# Patient Record
Sex: Male | Born: 1950 | Race: White | Hispanic: No | Marital: Single | Smoking: Never smoker
Health system: Southern US, Community
[De-identification: ages and names within clinical notes are randomized; demographics above are authoritative.]

## PROBLEM LIST (undated history)

## (undated) HISTORY — PX: COLONOSCOPY: SHX174

## (undated) HISTORY — PX: TONSILLECTOMY: SHX5217

## (undated) HISTORY — PX: WISDOM TOOTH EXTRACTION: SHX21

## (undated) HISTORY — PX: BRONCHOSCOPY: SUR163

---

## 1998-12-07 ENCOUNTER — Ambulatory Visit: Admission: RE | Admit: 1998-12-07 | Discharge: 1998-12-07 | Payer: Self-pay | Admitting: Internal Medicine

## 2020-03-14 DIAGNOSIS — Z862 Personal history of diseases of the blood and blood-forming organs and certain disorders involving the immune mechanism: Secondary | ICD-10-CM

## 2020-03-14 DIAGNOSIS — I340 Nonrheumatic mitral (valve) insufficiency: Secondary | ICD-10-CM

## 2020-03-14 DIAGNOSIS — I5021 Acute systolic (congestive) heart failure: Secondary | ICD-10-CM

## 2020-03-14 DIAGNOSIS — I361 Nonrheumatic tricuspid (valve) insufficiency: Secondary | ICD-10-CM

## 2020-03-14 DIAGNOSIS — I119 Hypertensive heart disease without heart failure: Secondary | ICD-10-CM

## 2020-03-14 DIAGNOSIS — E876 Hypokalemia: Secondary | ICD-10-CM

## 2020-03-15 DIAGNOSIS — I480 Paroxysmal atrial fibrillation: Secondary | ICD-10-CM

## 2020-04-13 ENCOUNTER — Other Ambulatory Visit: Payer: Self-pay

## 2020-04-13 DIAGNOSIS — R0602 Shortness of breath: Secondary | ICD-10-CM

## 2020-04-13 DIAGNOSIS — I5021 Acute systolic (congestive) heart failure: Secondary | ICD-10-CM | POA: Insufficient documentation

## 2020-04-13 DIAGNOSIS — Z91199 Patient's noncompliance with other medical treatment and regimen due to unspecified reason: Secondary | ICD-10-CM

## 2020-04-13 DIAGNOSIS — Z9119 Patient's noncompliance with other medical treatment and regimen: Secondary | ICD-10-CM

## 2020-04-13 DIAGNOSIS — F1010 Alcohol abuse, uncomplicated: Secondary | ICD-10-CM

## 2020-04-13 DIAGNOSIS — I480 Paroxysmal atrial fibrillation: Secondary | ICD-10-CM

## 2020-04-13 DIAGNOSIS — D869 Sarcoidosis, unspecified: Secondary | ICD-10-CM

## 2020-04-13 DIAGNOSIS — R079 Chest pain, unspecified: Secondary | ICD-10-CM

## 2020-04-13 DIAGNOSIS — I119 Hypertensive heart disease without heart failure: Secondary | ICD-10-CM

## 2020-04-13 DIAGNOSIS — J90 Pleural effusion, not elsewhere classified: Secondary | ICD-10-CM

## 2020-04-13 DIAGNOSIS — I509 Heart failure, unspecified: Secondary | ICD-10-CM | POA: Insufficient documentation

## 2020-04-13 DIAGNOSIS — I313 Pericardial effusion (noninflammatory): Secondary | ICD-10-CM

## 2020-04-13 DIAGNOSIS — E876 Hypokalemia: Secondary | ICD-10-CM

## 2020-04-13 DIAGNOSIS — Z862 Personal history of diseases of the blood and blood-forming organs and certain disorders involving the immune mechanism: Secondary | ICD-10-CM

## 2020-04-13 DIAGNOSIS — I3139 Other pericardial effusion (noninflammatory): Secondary | ICD-10-CM

## 2020-04-13 HISTORY — DX: Heart failure, unspecified: I50.9

## 2020-04-13 HISTORY — DX: Pleural effusion, not elsewhere classified: J90

## 2020-04-13 HISTORY — DX: Patient's noncompliance with other medical treatment and regimen: Z91.19

## 2020-04-13 HISTORY — DX: Personal history of diseases of the blood and blood-forming organs and certain disorders involving the immune mechanism: Z86.2

## 2020-04-13 HISTORY — DX: Hypokalemia: E87.6

## 2020-04-13 HISTORY — DX: Sarcoidosis, unspecified: D86.9

## 2020-04-13 HISTORY — DX: Alcohol abuse, uncomplicated: F10.10

## 2020-04-13 HISTORY — DX: Acute systolic (congestive) heart failure: I50.21

## 2020-04-13 HISTORY — DX: Chest pain, unspecified: R07.9

## 2020-04-13 HISTORY — DX: Shortness of breath: R06.02

## 2020-04-13 HISTORY — DX: Patient's noncompliance with other medical treatment and regimen due to unspecified reason: Z91.199

## 2020-04-13 HISTORY — DX: Paroxysmal atrial fibrillation: I48.0

## 2020-04-13 HISTORY — DX: Hypertensive heart disease without heart failure: I11.9

## 2020-04-13 HISTORY — DX: Other pericardial effusion (noninflammatory): I31.39

## 2020-04-13 HISTORY — DX: Pericardial effusion (noninflammatory): I31.3

## 2020-04-16 ENCOUNTER — Encounter: Payer: Self-pay | Admitting: *Deleted

## 2020-04-16 DIAGNOSIS — I160 Hypertensive urgency: Secondary | ICD-10-CM

## 2020-04-16 DIAGNOSIS — I10 Essential (primary) hypertension: Secondary | ICD-10-CM

## 2020-04-16 HISTORY — DX: Hypertensive urgency: I16.0

## 2020-04-16 HISTORY — DX: Essential (primary) hypertension: I10

## 2020-04-20 NOTE — Progress Notes (Signed)
Cardiology Office Note:    Date:  04/21/2020   ID:  John Ingram, DOB July 08, 1950, MRN [id number]  PCP:  Eloisa Northern, MD  Cardiologist:  Norman Herrlich, MD    Referring MD: No ref. provider found    ASSESSMENT:    1. Chronic systolic heart failure (HCC)   2. History of sarcoidosis   3. Paroxysmal atrial fibrillation (HCC)   4. Chronic anticoagulation    PLAN:    In order of problems listed above:  1. CHF- stable.  Will increase Entresto to 41/59 mg daily.  Would like to obtain Cardiac MRI to further evaluate for sarcoidosis as underlying etiology of CHF.  Check CMP, CBC, ProBnP today.  If documented signature of cardiac sarcoidosis on MR he would benefit from ICD therapy 2. He is maintaining sinus rhythm.  Reports occasional skipped beats.  Continue Amiodarone 200 mg daily.  Check TSH/T3/T4 today 3. Tolerating Eliquis.  No signs of bleeding.  Continue current anticoagulation therapy. 4. Medications refilled today.   Next appointment: 1 month   Medication Adjustments/Labs and Tests Ordered: Current medicines are reviewed at length with the patient today.  Concerns regarding medicines are outlined above.  Orders Placed This Encounter  Procedures  . MR CARDIAC MORPHOLOGY W WO CONTRAST  . CBC  . Comprehensive metabolic panel  . TSH+T4F+T3Free  . Pro b natriuretic peptide (BNP)  . EKG 12-Lead   Meds ordered this encounter  Medications  . sacubitril-valsartan (ENTRESTO) 49-51 MG    Sig: Take 1 tablet by mouth 2 (two) times daily.    Dispense:  60 tablet    Refill:  3  . amiodarone (PACERONE) 200 MG tablet    Sig: Take 1 tablet (200 mg total) by mouth daily.    Dispense:  90 tablet    Refill:  3  . potassium chloride SA (KLOR-CON) 20 MEQ tablet    Sig: Take 1 tablet (20 mEq total) by mouth daily.    Dispense:  90 tablet    Refill:  3  . metoprolol tartrate (LOPRESSOR) 25 MG tablet    Sig: Take 1 tablet (25 mg total) by mouth 2 (two) times daily.    Dispense:  180  tablet    Refill:  3  . furosemide (LASIX) 40 MG tablet    Sig: Take 1 tablet (40 mg total) by mouth 2 (two) times daily.    Dispense:  180 tablet    Refill:  3  . ELIQUIS 5 MG TABS tablet    Sig: Take 1 tablet (5 mg total) by mouth 2 (two) times daily.    Dispense:  180 tablet    Refill:  3    No chief complaint on file.   History of Present Illness:    John Ingram is a 70 y.o. male with a hx of Sarcoidosis, CHF last seen in [facility] for acute CHF exacerbation and new onset AFib. Compliance with diet, lifestyle and medications: yes  CTA hospital 03/14/2020 chest no findings of aortopathy or pulmonary embolism but he did have small pleural effusions and small pericardial effusion.  And calcified mediastinal lymph nodes compatible with sarcoidosis. 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  Right ventricular function was normal there is also  mild mitral regurgitation mild tricuspid regurgitation with pulmonary artery systolic pressure 65 mmHg and a trivial pericardial effusion EKG performed 03/14/2020 showed atrial fibrillation with a rapid ventricular response. EKG 03/15/2020 from the ED showed sinus tachycardia Labs performed at the same time showed a potassium of 3.6 sodium 136 creatinine 0.9 GFR greater than 60 cc troponin a zone 0.08 BMP pro was quite elevated 1130 Cholesterol 117 LDL 84 triglyceride 103 HDL 72 Hemoglobin 15.4 hematocrit 45.5 INR 1.0  He was seen by me during the hospitalization I personally reviewed his EKG which showed sinus rhythm and left atrial enlargement.  Prior to discharge she was maintaining sinus rhythm on oral amiodarone load 40 mg twice daily  for 2 weeks and then 400 mg daily.  His heart failure was improved he was euvolemic asymptomatic on oral diuretic beta-blocker Entresto and potassium supplementation.  When seen in consultation by me I felt that electively should undergo cardiac MRI to define if he has cardiac sarcoidosis.   Mr Andringa reports no acute concerns today.  He reports improvement of symptoms.  Denies any chest pain, shortness of breath or lower extremity edema.  His activity level has return to baseline prior to hospitalization.  He endorses occasional skipped heart beats but has had that for years.  He has recently run out of his Amiodarone and Potassium meds.  He continues to have no shortness of breath edema palpitation or chest pain. Past Medical History:  Diagnosis Date  . Acute systolic heart failure (HCC) 04/13/2020  . Alcohol abuse 04/13/2020  . Chest pain at rest 04/13/2020  . CHF exacerbation (HCC) 04/13/2020  . History of sarcoidosis 04/13/2020  . Hypertensive heart disease 04/13/2020  . Hypertensive urgency 04/16/2020  . Hypokalemia 04/13/2020  . Noncompliance 04/13/2020  . Paroxysmal A-fib (HCC) 04/13/2020  . Pericardial effusion 04/13/2020  . Pleural effusion 04/13/2020  . Sarcoidosis 04/13/2020  . Shortness of breath 04/13/2020    Past Surgical History:  Procedure Laterality Date  . BRONCHOSCOPY    . COLONOSCOPY     X 2  . TONSILLECTOMY    . WISDOM TOOTH EXTRACTION      Current Medications: Current Meds  Medication Sig  . sacubitril-valsartan (ENTRESTO) 49-51 MG Take 1 tablet by mouth 2 (two) times daily.  Marland Kitchen thiamine 100 MG tablet Take 100 mg by mouth daily.  . [DISCONTINUED] ELIQUIS 5 MG TABS tablet Take 5 mg by mouth 2 (two) times daily.  . [DISCONTINUED] ENTRESTO 24-26 MG Take 1 tablet by mouth 2 (two) times daily.  . [DISCONTINUED] furosemide (LASIX) 40 MG tablet Take 40 mg by mouth 2 (two) times daily.  . [DISCONTINUED] metoprolol tartrate (LOPRESSOR) 25 MG tablet Take 25 mg by mouth 2  (two) times daily.  . [DISCONTINUED] potassium chloride SA (KLOR-CON) 20 MEQ tablet Take 20 mEq by mouth daily.     Allergies:   Patient has no known allergies.   Social History   Socioeconomic History  . Marital status: Single    Spouse name: Not on file  . Number of children: Not on file  . Years of education: Not on file  . Highest education level: Not on file  Occupational History  . Not on file  Tobacco Use  . Smoking status: Never Smoker  . Smokeless tobacco: Never Used  Vaping Use  . Vaping Use: Never used  Substance and Sexual Activity  . Alcohol use: Not Currently  . Drug use: Never  . Sexual activity: Not on file  Other  Topics Concern  . Not on file  Social History Narrative  . Not on file   Social Determinants of Health   Financial Resource Strain: Not on file  Food Insecurity: Not on file  Transportation Needs: Not on file  Physical Activity: Not on file  Stress: Not on file  Social Connections: Not on file     Family History: The patient's family history includes Aneurysm in his mother; Hypertension in his mother; Pancreatic cancer in his father; Prostate cancer in his father; Skin cancer in his paternal grandfather. ROS:   Please see the history of present illness.    All other systems reviewed and are negative.  EKGs/Labs/Other Studies Reviewed:    The following studies were reviewed today:  EKG:  EKG ordered today and personally reviewed.  The ekg ordered today demonstrates NSR with LVH.   Recent Labs: No results found for requested labs within last 8760 hours.  Recent Lipid Panel No results found for: CHOL, TRIG, HDL, CHOLHDL, VLDL, LDLCALC, LDLDIRECT  Physical Exam:    VS:  BP (!) 156/100   Pulse 69   Ht 5\' 10"  (1.778 m)   Wt 211 lb 9.6 oz (96 kg)   SpO2 97%   BMI 30.36 kg/m     Wt Readings from Last 3 Encounters:  04/21/20 211 lb 9.6 oz (96 kg)     GEN:  Well nourished, well developed in no acute distress HEENT: Normal NECK:  No JVD; No carotid bruits LYMPHATICS: No lymphadenopathy CARDIAC: RRR, no murmurs, rubs, gallops RESPIRATORY:  Clear to auscultation without rales, wheezing or rhonchi  ABDOMEN: Soft, non-tender, non-distended MUSCULOSKELETAL:  No edema; No deformity  SKIN: Warm and dry NEUROLOGIC:  Alert and oriented x 3 PSYCHIATRIC:  Normal affect    Signed, 04/23/20, MD  04/21/2020 4:41 PM    [facility]

## 2020-04-21 ENCOUNTER — Ambulatory Visit (INDEPENDENT_AMBULATORY_CARE_PROVIDER_SITE_OTHER): Payer: Self-pay | Admitting: Cardiology

## 2020-04-21 ENCOUNTER — Other Ambulatory Visit: Payer: Self-pay

## 2020-04-21 ENCOUNTER — Encounter: Payer: Self-pay | Admitting: Cardiology

## 2020-04-21 VITALS — BP 156/100 | HR 69 | Ht 70.0 in | Wt 211.6 lb

## 2020-04-21 DIAGNOSIS — Z862 Personal history of diseases of the blood and blood-forming organs and certain disorders involving the immune mechanism: Secondary | ICD-10-CM

## 2020-04-21 DIAGNOSIS — Z7901 Long term (current) use of anticoagulants: Secondary | ICD-10-CM

## 2020-04-21 DIAGNOSIS — I480 Paroxysmal atrial fibrillation: Secondary | ICD-10-CM

## 2020-04-21 DIAGNOSIS — I5022 Chronic systolic (congestive) heart failure: Secondary | ICD-10-CM

## 2020-04-21 MED ORDER — ENTRESTO 49-51 MG PO TABS: 1.0000 | ORAL_TABLET | Freq: Two times a day (BID) | ORAL | 3 refills | Status: DC

## 2020-04-21 MED ORDER — METOPROLOL TARTRATE 25 MG PO TABS: 25.0000 mg | ORAL_TABLET | Freq: Two times a day (BID) | ORAL | 3 refills | Status: DC

## 2020-04-21 MED ORDER — AMIODARONE HCL 200 MG PO TABS: 200.0000 mg | ORAL_TABLET | Freq: Every day | ORAL | 3 refills | Status: DC

## 2020-04-21 MED ORDER — FUROSEMIDE 40 MG PO TABS: 40.0000 mg | ORAL_TABLET | Freq: Two times a day (BID) | ORAL | 3 refills | Status: DC

## 2020-04-21 MED ORDER — ELIQUIS 5 MG PO TABS: 5.0000 mg | ORAL_TABLET | Freq: Two times a day (BID) | ORAL | 3 refills | Status: DC

## 2020-04-21 MED ORDER — POTASSIUM CHLORIDE CRYS ER 20 MEQ PO TBCR: 20.0000 meq | EXTENDED_RELEASE_TABLET | Freq: Every day | ORAL | 3 refills | Status: DC

## 2020-04-21 NOTE — Patient Instructions (Signed)
Medication Instructions:  Your physician has recommended you make the following change in your medication:  INCREASE: Entresto 49/51 take one tablet by mouth twice daily.  *If you need a refill on your cardiac medications before your next appointment, please call your pharmacy*   Lab Work: Your physician recommends that you return for lab work in: TODAY CMP, ProBNP, CBC, TSH, T3, T4 If you have labs (blood work) drawn today and your tests are completely normal, you will receive your results only by: Marland Kitchen MyChart Message (if you have MyChart) OR . A paper copy in the mail If you have any lab test that is abnormal or we need to change your treatment, we will call you to review the results.   Testing/Procedures: Your physician has requested that you have a cardiac MRI. Cardiac MRI uses a computer to create images of your heart as its beating, producing both still and moving pictures of your heart and major blood vessels. For further information please visit [email or web address]. Please follow the instruction sheet given to you today for more information.  You are scheduled for Cardiac MRI on ______. Please arrive at the [facility] main entrance of [facility] at ______ (30-45 minutes prior to test start time). ?   [facility]  [address]  [place], [place] [address]  [phone]  Proceed to the [facility] Radiology Department (First Floor).  ?  Magnetic resonance imaging (MRI) is a painless test that produces images of the inside of the body without using X-rays. During an MRI, strong magnets and radio waves work together in a Data processing manager to form detailed images. MRI images may provide more details about a medical condition than X-rays, CT scans, and ultrasounds can provide.   You may be given earphones to listen for instructions.   You may eat a light breakfast and take medications as ordered with the exception of Furosemide (fluid pill, other).  If a contrast  material will be used, an IV will be inserted into one of your veins. Contrast material will be injected into your IV.   You will be asked to remove all metal, including: Watch, jewelry, and other metal objects including hearing aids, hair pieces and dentures. (Braces and fillings normally are not a problem.)   If contrast material was used:  It will leave your body through your urine within a day. You may be told to drink plenty of fluids to help flush the contrast material out of your system.  TEST WILL TAKE APPROXIMATELY 1 HOUR  PLEASE NOTIFY SCHEDULING AT LEAST 24 HOURS IN ADVANCE IF YOU ARE UNABLE TO KEEP YOUR APPOINTMENT.     Follow-Up: At [facility], you and your health needs are our priority.  As part of our continuing mission to provide you with exceptional heart care, we have created designated Provider Care Teams.  These Care Teams include your primary Cardiologist (physician) and Advanced Practice Providers (APPs -  Physician Assistants and Nurse Practitioners) who all work together to provide you with the care you need, when you need it.  We recommend signing up for the patient portal called "MyChart".  Sign up information is provided on this After Visit Summary.  MyChart is used to connect with patients for Virtual Visits (Telemedicine).  Patients are able to view lab/test results, encounter notes, upcoming appointments, etc.  Non-urgent messages can be sent to your provider as well.   To learn more about what you can do with MyChart, go to  [email or web address].    Your next appointment:   1 month(s)  The format for your next appointment:   In Person  Provider:   Norman Herrlich, MD   Other Instructions

## 2020-04-22 ENCOUNTER — Telehealth: Payer: Self-pay

## 2020-04-22 ENCOUNTER — Telehealth: Payer: Self-pay | Admitting: Cardiology

## 2020-04-22 LAB — CBC
Hematocrit: 51.8 % — ABNORMAL HIGH (ref 37.5–51.0)
Hemoglobin: 17.2 g/dL (ref 13.0–17.7)
MCH: 29.7 pg (ref 26.6–33.0)
MCHC: 33.2 g/dL (ref 31.5–35.7)
MCV: 89 fL (ref 79–97)
Platelets: 212 10*3/uL (ref 150–450)
RBC: 5.8 x10E6/uL (ref 4.14–5.80)
RDW: 11.9 % (ref 11.6–15.4)
WBC: 7.1 10*3/uL (ref 3.4–10.8)

## 2020-04-22 LAB — COMPREHENSIVE METABOLIC PANEL
ALT: 15 IU/L (ref 0–44)
AST: 21 IU/L (ref 0–40)
Albumin/Globulin Ratio: 2.2 (ref 1.2–2.2)
Albumin: 4.6 g/dL (ref 3.8–4.8)
Alkaline Phosphatase: 105 IU/L (ref 44–121)
BUN/Creatinine Ratio: 17 (ref 10–24)
BUN: 20 mg/dL (ref 8–27)
Bilirubin Total: 0.9 mg/dL (ref 0.0–1.2)
CO2: 26 mmol/L (ref 20–29)
Calcium: 9.4 mg/dL (ref 8.6–10.2)
Chloride: 98 mmol/L (ref 96–106)
Creatinine, Ser: 1.16 mg/dL (ref 0.76–1.27)
GFR calc Af Amer: 74 mL/min/{1.73_m2} (ref >59)
GFR calc non Af Amer: 64 mL/min/{1.73_m2} (ref >59)
Globulin, Total: 2.1 g/dL (ref 1.5–4.5)
Glucose: 104 mg/dL — ABNORMAL HIGH (ref 65–99)
Potassium: 4.6 mmol/L (ref 3.5–5.2)
Sodium: 142 mmol/L (ref 134–144)
Total Protein: 6.7 g/dL (ref 6.0–8.5)

## 2020-04-22 LAB — PRO B NATRIURETIC PEPTIDE: NT-Pro BNP: 832 pg/mL — ABNORMAL HIGH (ref 0–376)

## 2020-04-22 LAB — TSH+T4F+T3FREE
Free T4: 1.51 ng/dL (ref 0.82–1.77)
T3, Free: 3.1 pg/mL (ref 2.0–4.4)
TSH: 2.98 u[IU]/mL (ref 0.450–4.500)

## 2020-04-22 NOTE — Telephone Encounter (Signed)
Spoke with patient regarding results and recommendation.  Patient verbalizes understanding and is agreeable to plan of care. Advised patient to call back with any issues or concerns.  

## 2020-04-22 NOTE — Telephone Encounter (Signed)
Tried calling patient. No answer and no voicemail set up for me to leave a message. 

## 2020-04-22 NOTE — Telephone Encounter (Signed)
Spoke with patient regarding preferred weekdays and times for scheduling the Cardiac MRI ordered by Dr. Dulce Sellar.  Informed patient I will call with appointment information as soon as we hear regarding the prior authorization.

## 2020-04-22 NOTE — Telephone Encounter (Signed)
-----   Message from Brian J Munley, MD sent at 04/22/2020  7:55 AM EST ----- Good result no changes 

## 2020-04-22 NOTE — Telephone Encounter (Signed)
-----   Message from Baldo Daub, MD sent at 04/22/2020  7:55 AM EST ----- Good result no changes

## 2020-05-03 ENCOUNTER — Telehealth: Payer: Self-pay | Admitting: Cardiology

## 2020-05-03 ENCOUNTER — Encounter: Payer: Self-pay | Admitting: Cardiology

## 2020-05-03 NOTE — Telephone Encounter (Signed)
Spoke with patient regarding appointment on 06/01/20 at 12:00pm for the Cardiac MRI ordered by Dr. Alexander Mt time is 11:30 am --1st floor admissions office at [facility]---will mail information to patient

## 2020-05-17 ENCOUNTER — Telehealth: Payer: Self-pay | Admitting: Cardiology

## 2020-05-17 MED ORDER — METOPROLOL TARTRATE 25 MG PO TABS: 25.0000 mg | ORAL_TABLET | Freq: Two times a day (BID) | ORAL | 3 refills | Status: DC

## 2020-05-17 NOTE — Telephone Encounter (Signed)
Refill sent in per request.  

## 2020-05-17 NOTE — Telephone Encounter (Signed)
Patient came in office requesting refill on   metoprolol tartrate (LOPRESSOR) 25 MG tablet   Please advise.

## 2020-05-31 ENCOUNTER — Telehealth (HOSPITAL_COMMUNITY): Payer: Self-pay | Admitting: *Deleted

## 2020-05-31 NOTE — Telephone Encounter (Signed)
Reaching out to patient to offer assistance regarding upcoming cardiac imaging study; pt verbalizes understanding of appt date/time, parking situation and where to check in, and verified current allergies; name and call back number provided for further questions should they arise  Turrell Severt RN Navigator Cardiac Imaging [facility] [phone] office [phone] cell  

## 2020-06-01 ENCOUNTER — Other Ambulatory Visit: Payer: Self-pay

## 2020-06-01 ENCOUNTER — Ambulatory Visit (HOSPITAL_COMMUNITY)
Admission: RE | Admit: 2020-06-01 | Discharge: 2020-06-01 | Disposition: A | Discharge status: Home / Self Care | Payer: Self-pay | Source: Ambulatory Visit | Attending: Cardiology | Admitting: Cardiology

## 2020-06-01 DIAGNOSIS — Z862 Personal history of diseases of the blood and blood-forming organs and certain disorders involving the immune mechanism: Secondary | ICD-10-CM | POA: Insufficient documentation

## 2020-06-01 MED ORDER — GADOBUTROL 1 MMOL/ML IV SOLN
10.0000 mL | Freq: Once | INTRAVENOUS | Status: AC | PRN
  Administered 2020-06-01: 10 mL via INTRAVENOUS

## 2020-06-02 ENCOUNTER — Encounter: Payer: Self-pay | Admitting: Cardiology

## 2020-06-02 ENCOUNTER — Ambulatory Visit (INDEPENDENT_AMBULATORY_CARE_PROVIDER_SITE_OTHER): Payer: Self-pay | Admitting: Cardiology

## 2020-06-02 VITALS — BP 130/90 | HR 60 | Ht 70.0 in | Wt 210.0 lb

## 2020-06-02 DIAGNOSIS — I480 Paroxysmal atrial fibrillation: Secondary | ICD-10-CM

## 2020-06-02 DIAGNOSIS — I5022 Chronic systolic (congestive) heart failure: Secondary | ICD-10-CM

## 2020-06-02 DIAGNOSIS — Z7901 Long term (current) use of anticoagulants: Secondary | ICD-10-CM

## 2020-06-02 MED ORDER — SACUBITRIL-VALSARTAN 97-103 MG PO TABS: 1.0000 | ORAL_TABLET | Freq: Two times a day (BID) | ORAL | 3 refills | Status: DC

## 2020-06-02 NOTE — Progress Notes (Signed)
Cardiology Office Note:    Date:  06/02/2020   ID:  John Ingram, DOB Oct 19, 1950, MRN [id number]  PCP:  Eloisa Northern, MD  Cardiologist:  Norman Herrlich, MD    Referring MD: Eloisa Northern, MD    ASSESSMENT:    1. Chronic systolic heart failure (HCC)   2. Acute systolic heart failure (HCC)   3. Paroxysmal atrial fibrillation (HCC)   4. Chronic anticoagulation    PLAN:    In order of problems listed above:  1. He is improved tolerating guideline directed therapy with a significant improvement ejection fraction from severely reduced to mild to moderately ill continue therapy including amiodarone Eliquis maintaining sinus rhythm his loop diuretic beta-blocker Entresto and will plan to reassess ejection fraction 6 months. 2. Able maintaining sinus rhythm continue amiodarone Eliquis also recheck labs on amiodarone 3. Continue his  anticoagulant   Next appointment: 6 months   Medication Adjustments/Labs and Tests Ordered: Current medicines are reviewed at length with the patient today.  Concerns regarding medicines are outlined above.  Orders Placed This Encounter  Procedures  . EKG 12-Lead   No orders of the defined types were placed in this encounter.   Chief Complaint  Patient presents with  . Follow-up    History of Present Illness:    John Ingram is a 70 y.o. male with a hx of sarcoidosis and heart failure last seen 02/19/2021 Compliance with diet, lifestyle and medications: Yes  CTA hospital 03/14/2020 chest no findings of aortopathy or pulmonary embolism but he did have small pleural effusions and small pericardial effusion.  And calcified mediastinal lymph nodes compatible with sarcoidosis. 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  Right ventricular function was normal there is also mild mitral regurgitation mild tricuspid regurgitation with pulmonary artery systolic pressure 65 mmHg and a trivial pericardial effusion  With concerns of infiltrative cardiomyopathy you underwent cardiac MRI 06/01/2020 showing EF of 44% with basal anteroseptal and inferolateral hypokinesia normal right ventricular size and function EF 54% he did have some late gadolinium enhancement with the recommendation of evaluation for coronary artery disease.  Tolerates guideline directed therapy is not checking home blood pressures and has had no edema chest pain shortness of breath palpitation or syncope. He has had an objective increase in LV systolic function. I reviewed his report with him late gadolinium enhancement and he does not want to do a cardiac CTA at this time prefers to follow-up in the office and to reassess his ejection fraction next visit by echocardiogram. We will uptitrate his dose of Entresto next visit. Past Medical History:  Diagnosis Date  . Acute systolic heart failure (HCC) 04/13/2020  . Alcohol abuse 04/13/2020  . Chest pain at rest 04/13/2020  . CHF exacerbation (HCC) 04/13/2020  . History of sarcoidosis 04/13/2020  . Hypertensive heart disease 04/13/2020  . Hypertensive urgency 04/16/2020  . Hypokalemia 04/13/2020  . Noncompliance 04/13/2020  . Paroxysmal A-fib (HCC) 04/13/2020  . Pericardial effusion 04/13/2020  . Pleural effusion 04/13/2020  . Sarcoidosis 04/13/2020  . Shortness of breath 04/13/2020    Past Surgical History:  Procedure Laterality Date  . BRONCHOSCOPY    . COLONOSCOPY     X 2  . TONSILLECTOMY    .  WISDOM TOOTH EXTRACTION      Current Medications: Current Meds  Medication Sig  . amiodarone (PACERONE) 200 MG tablet Take 1 tablet (200 mg total) by mouth daily.  Marland Kitchen ELIQUIS 5 MG TABS tablet Take 1 tablet (5 mg total) by mouth 2 (two) times  daily.  . furosemide (LASIX) 40 MG tablet Take 1 tablet (40 mg total) by mouth 2 (two) times daily.  . metoprolol tartrate (LOPRESSOR) 25 MG tablet Take 1 tablet (25 mg total) by mouth 2 (two) times daily.  . potassium chloride SA (KLOR-CON) 20 MEQ tablet Take 1 tablet (20 mEq total) by mouth daily.  . sacubitril-valsartan (ENTRESTO) 49-51 MG Take 1 tablet by mouth 2 (two) times daily.  Marland Kitchen thiamine 100 MG tablet Take 100 mg by mouth daily.     Allergies:   Patient has no known allergies.   Social History   Socioeconomic History  . Marital status: Single    Spouse name: Not on file  . Number of children: Not on file  . Years of education: Not on file  . Highest education level: Not on file  Occupational History  . Not on file  Tobacco Use  . Smoking status: Never Smoker  . Smokeless tobacco: Never Used  Vaping Use  . Vaping Use: Never used  Substance and Sexual Activity  . Alcohol use: Not Currently  . Drug use: Never  . Sexual activity: Not on file  Other Topics Concern  . Not on file  Social History Narrative  . Not on file   Social Determinants of Health   Financial Resource Strain: Not on file  Food Insecurity: Not on file  Transportation Needs: Not on file  Physical Activity: Not on file  Stress: Not on file  Social Connections: Not on file     Family History: The patient's family history includes Aneurysm in his mother; Hypertension in his mother; Pancreatic cancer in his father; Prostate cancer in his father; Skin cancer in his paternal grandfather. ROS:   Please see the history of present illness.    All other systems reviewed and are negative.  EKGs/Labs/Other Studies Reviewed:    The following studies were reviewed today:  EKG:  EKG ordered today and personally reviewed.  The ekg ordered today demonstrates sinus rhythm and is normal he does not have conduction delay  Recent Labs: 04/21/2020: ALT 15; BUN 20; Creatinine, Ser 1.16; Hemoglobin 17.2; NT-Pro  BNP 832; Platelets 212; Potassium 4.6; Sodium 142; TSH 2.980  Recent Lipid Panel No results found for: CHOL, TRIG, HDL, CHOLHDL, VLDL, LDLCALC, LDLDIRECT  Physical Exam:    VS:  BP 130/90 (BP Location: Right Arm, Patient Position: Sitting, Cuff Size: Normal)   Pulse 60   Ht 5\' 10"  (1.778 m)   Wt 210 lb (95.3 kg)   SpO2 98%   BMI 30.13 kg/m     Wt Readings from Last 3 Encounters:  06/02/20 210 lb (95.3 kg)  04/21/20 211 lb 9.6 oz (96 kg)     GEN:  Well nourished, well developed in no acute distress HEENT: Normal NECK: No JVD; No carotid bruits LYMPHATICS: No lymphadenopathy CARDIAC: RRR, no murmurs, rubs, gallops RESPIRATORY:  Clear to auscultation without rales, wheezing or rhonchi  ABDOMEN: Soft, non-tender, non-distended MUSCULOSKELETAL:  No edema; No deformity  SKIN: Warm and dry NEUROLOGIC:  Alert and oriented x 3 PSYCHIATRIC:  Normal affect    Signed, 04/23/20, MD  06/02/2020 1:57 PM    [facility]

## 2020-06-02 NOTE — Patient Instructions (Signed)
Medication Instructions:  Your physician has recommended you make the following change in your medication:  INCREASE: Entresto 97/103 take one tablet by mouth twice daily.  *If you need a refill on your cardiac medications before your next appointment, please call your pharmacy*   Lab Work: Your physician recommends that you return for lab work in: 2 weeks CMP, TSH , T3, T4 If you have labs (blood work) drawn today and your tests are completely normal, you will receive your results only by: Marland Kitchen MyChart Message (if you have MyChart) OR . A paper copy in the mail If you have any lab test that is abnormal or we need to change your treatment, we will call you to review the results.   Testing/Procedures: None   Follow-Up: At [facility], you and your health needs are our priority.  As part of our continuing mission to provide you with exceptional heart care, we have created designated Provider Care Teams.  These Care Teams include your primary Cardiologist (physician) and Advanced Practice Providers (APPs -  Physician Assistants and Nurse Practitioners) who all work together to provide you with the care you need, when you need it.  We recommend signing up for the patient portal called "MyChart".  Sign up information is provided on this After Visit Summary.  MyChart is used to connect with patients for Virtual Visits (Telemedicine).  Patients are able to view lab/test results, encounter notes, upcoming appointments, etc.  Non-urgent messages can be sent to your provider as well.   To learn more about what you can do with MyChart, go to [email or web address].    Your next appointment:   6 month(s)  The format for your next appointment:   In Person  Provider:   Norman Herrlich, MD   Other Instructions

## 2020-12-07 ENCOUNTER — Other Ambulatory Visit: Payer: Self-pay

## 2020-12-07 ENCOUNTER — Encounter: Payer: Self-pay | Admitting: Cardiology

## 2020-12-07 ENCOUNTER — Ambulatory Visit (INDEPENDENT_AMBULATORY_CARE_PROVIDER_SITE_OTHER): Payer: Self-pay | Admitting: Cardiology

## 2020-12-07 VITALS — BP 144/88 | HR 73 | Ht 70.0 in | Wt 216.2 lb

## 2020-12-07 DIAGNOSIS — I5022 Chronic systolic (congestive) heart failure: Secondary | ICD-10-CM

## 2020-12-07 DIAGNOSIS — I480 Paroxysmal atrial fibrillation: Secondary | ICD-10-CM

## 2020-12-07 DIAGNOSIS — Z862 Personal history of diseases of the blood and blood-forming organs and certain disorders involving the immune mechanism: Secondary | ICD-10-CM

## 2020-12-07 DIAGNOSIS — Z79899 Other long term (current) drug therapy: Secondary | ICD-10-CM

## 2020-12-07 DIAGNOSIS — Z7901 Long term (current) use of anticoagulants: Secondary | ICD-10-CM

## 2020-12-07 NOTE — Patient Instructions (Signed)
Medication Instructions:  Your physician recommends that you continue on your current medications as directed. Please refer to the Current Medication list given to you today.  *If you need a refill on your cardiac medications before your next appointment, please call your pharmacy*   Lab Work: Your physician recommends that you return for lab work in: TODAY CMP, Lipids, TSH. ProBNP If you have labs (blood work) drawn today and your tests are completely normal, you will receive your results only by: MyChart Message (if you have MyChart) OR A paper copy in the mail If you have any lab test that is abnormal or we need to change your treatment, we will call you to review the results.   Testing/Procedures: None   Follow-Up: At [facility], you and your health needs are our priority.  As part of our continuing mission to provide you with exceptional heart care, we have created designated Provider Care Teams.  These Care Teams include your primary Cardiologist (physician) and Advanced Practice Providers (APPs -  Physician Assistants and Nurse Practitioners) who all work together to provide you with the care you need, when you need it.  We recommend signing up for the patient portal called "MyChart".  Sign up information is provided on this After Visit Summary.  MyChart is used to connect with patients for Virtual Visits (Telemedicine).  Patients are able to view lab/test results, encounter notes, upcoming appointments, etc.  Non-urgent messages can be sent to your provider as well.   To learn more about what you can do with MyChart, go to [email or web address].    Your next appointment:   6 month(s)  The format for your next appointment:   In Person  Provider:   Norman Herrlich, MD   Other Instructions

## 2020-12-07 NOTE — Progress Notes (Signed)
Cardiology Office Note:    Date:  12/07/2020   ID:  John Ingram, DOB 1950/11/26, MRN [id number]  PCP:  Eloisa Northern, MD  Cardiologist:  Norman Herrlich, MD    Referring MD: Eloisa Northern, MD    ASSESSMENT:    1. Paroxysmal atrial fibrillation (HCC)   2. Chronic anticoagulation   3. Chronic systolic heart failure (HCC)   4. History of sarcoidosis    PLAN:    In order of problems listed above:  Rey continues to do well maintaining sinus rhythm on low-dose amiodarone without toxicity and anticoagulant continue the same check liver function thyroid studies for toxicity Stable compensated he has no volume overload he will continue his current treatment including diuretic beta-blocker and high-dose Entresto.  He declines rechecking an echocardiogram at this point and declined SGLT2 inhibitor optimize heart failure treatment Stable asymptomatic   Next appointment: 6 months   Medication Adjustments/Labs and Tests Ordered: Current medicines are reviewed at length with the patient today.  Concerns regarding medicines are outlined above.  No orders of the defined types were placed in this encounter.  No orders of the defined types were placed in this encounter.   Chief complaint follow-up for atrial fibrillation and heart failure   History of Present Illness:    John Ingram is a 70 y.o. 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  With concerns of infiltrative cardiomyopathy you underwent cardiac MRI 06/01/2020 showing EF of 44% with basal anteroseptal and inferolateral hypokinesia normal right ventricular size and function EF 54% he did have some late gadolinium enhancement with the recommendation of evaluation for coronary artery disease.  When last seen 06/02/2020 he was improved on guideline directed therapy including amiodarone to maintain sinus rhythm with paroxysmal atrial fibrillation  anticoagulation with Eliquis and Entresto therapy.  He declined an ischemia evaluation with cardiac CTA at that time.  Compliance with diet, lifestyle and medications: Yes  Remains vigorous and active recently fatigues after heavy alcohol block but is not having shortness of breath edema orthopnea chest pain palpitation or syncope.  He is unaware of any recurrent atrial fibrillation and tolerates his anticoagulant without muscle pain or weakness. We discussed rechecking checking an echocardiogram he declines at this time We discussed adding an SGLT2 inhibitor he declines He has to take aspirin instead of Eliquis and I described that it does not prevent stroke in his situation he said he can continue his current anticoagulant. Past Medical History:  Diagnosis Date   Acute systolic heart failure (HCC) 04/13/2020   Alcohol abuse 04/13/2020   Chest pain at rest 04/13/2020   CHF exacerbation (HCC) 04/13/2020   History of sarcoidosis 04/13/2020   Hypertensive heart disease 04/13/2020   Hypertensive urgency 04/16/2020   Hypokalemia 04/13/2020   Noncompliance 04/13/2020   Paroxysmal A-fib (HCC) 04/13/2020   Pericardial effusion 04/13/2020   Pleural effusion 04/13/2020   Sarcoidosis 04/13/2020   Shortness of breath 04/13/2020    Past Surgical History:  Procedure Laterality Date   BRONCHOSCOPY     COLONOSCOPY     X 2   TONSILLECTOMY     WISDOM TOOTH EXTRACTION      Current Medications: Current Meds  Medication Sig   amiodarone (PACERONE) 200 MG tablet Take 1 tablet (200 mg total) by mouth daily.   ELIQUIS 5 MG TABS tablet Take 1 tablet (5 mg total) by mouth 2 (two) times daily.   furosemide (LASIX) 40  MG tablet Take 1 tablet (40 mg total) by mouth 2 (two) times daily.   metoprolol tartrate (LOPRESSOR) 25 MG tablet Take 1 tablet (25 mg total) by mouth 2 (two) times daily.   potassium chloride SA (KLOR-CON) 20 MEQ tablet Take 1 tablet (20 mEq total) by mouth daily.   sacubitril-valsartan (ENTRESTO)  97-103 MG Take 1 tablet by mouth 2 (two) times daily.   thiamine 100 MG tablet Take 100 mg by mouth daily.     Allergies:   Patient has no known allergies.   Social History   Socioeconomic History   Marital status: Single    Spouse name: Not on file   Number of children: Not on file   Years of education: Not on file   Highest education level: Not on file  Occupational History   Not on file  Tobacco Use   Smoking status: Never   Smokeless tobacco: Never  Vaping Use   Vaping Use: Never used  Substance and Sexual Activity   Alcohol use: Not Currently   Drug use: Never   Sexual activity: Not on file  Other Topics Concern   Not on file  Social History Narrative   Not on file   Social Determinants of Health   Financial Resource Strain: Not on file  Food Insecurity: Not on file  Transportation Needs: Not on file  Physical Activity: Not on file  Stress: Not on file  Social Connections: Not on file     Family History: The patient's family history includes Aneurysm in his mother; Hypertension in his mother; Pancreatic cancer in his father; Prostate cancer in his father; Skin cancer in his paternal grandfather. ROS:   Please see the history of present illness.    All other systems reviewed and are negative.  EKGs/Labs/Other Studies Reviewed:    The following studies were reviewed today:  EKG:  EKG ordered today and personally reviewed.  The ekg ordered today demonstrates sinus rhythm first-degree AV block  Recent Labs: 04/21/2020: ALT 15; BUN 20; Creatinine, Ser 1.16; Hemoglobin 17.2; NT-Pro BNP 832; Platelets 212; Potassium 4.6; Sodium 142; TSH 2.980  Recent Lipid Panel No results found for: CHOL, TRIG, HDL, CHOLHDL, VLDL, LDLCALC, LDLDIRECT  Physical Exam:    VS:  BP (!) 144/88 (BP Location: Right Arm, Patient Position: Sitting)   Pulse 73   Ht 5\' 10"  (1.778 m)   Wt 216 lb 3.2 oz (98.1 kg)   SpO2 99%   BMI 31.02 kg/m     Wt Readings from Last 3 Encounters:   12/07/20 216 lb 3.2 oz (98.1 kg)  06/02/20 210 lb (95.3 kg)  04/21/20 211 lb 9.6 oz (96 kg)     GEN: Does not look chronically ill no skin lesions of sarcoidosis well nourished, well developed in no acute distress HEENT: Normal NECK: No JVD; No carotid bruits LYMPHATICS: No lymphadenopathy CARDIAC: RRR, no murmurs, rubs, gallops RESPIRATORY:  Clear to auscultation without rales, wheezing or rhonchi  ABDOMEN: Soft, non-tender, non-distended MUSCULOSKELETAL:  No edema; No deformity  SKIN: Warm and dry NEUROLOGIC:  Alert and oriented x 3 PSYCHIATRIC:  Normal affect    Signed, 04/23/20, MD  12/07/2020 2:26 PM    [facility]

## 2020-12-08 ENCOUNTER — Telehealth: Payer: Self-pay

## 2020-12-08 LAB — COMPREHENSIVE METABOLIC PANEL
ALT: 15 IU/L (ref 0–44)
AST: 20 IU/L (ref 0–40)
Albumin/Globulin Ratio: 2.3 — ABNORMAL HIGH (ref 1.2–2.2)
Albumin: 4.5 g/dL (ref 3.8–4.8)
Alkaline Phosphatase: 97 IU/L (ref 44–121)
BUN/Creatinine Ratio: 18 (ref 10–24)
BUN: 18 mg/dL (ref 8–27)
Bilirubin Total: 0.6 mg/dL (ref 0.0–1.2)
CO2: 26 mmol/L (ref 20–29)
Calcium: 9.7 mg/dL (ref 8.6–10.2)
Chloride: 98 mmol/L (ref 96–106)
Creatinine, Ser: 1 mg/dL (ref 0.76–1.27)
Globulin, Total: 2 g/dL (ref 1.5–4.5)
Glucose: 91 mg/dL (ref 65–99)
Potassium: 4.2 mmol/L (ref 3.5–5.2)
Sodium: 140 mmol/L (ref 134–144)
Total Protein: 6.5 g/dL (ref 6.0–8.5)
eGFR: 81 mL/min/{1.73_m2} (ref >59)

## 2020-12-08 LAB — LIPID PANEL
Chol/HDL Ratio: 3.3 ratio (ref 0.0–5.0)
Cholesterol, Total: 261 mg/dL — ABNORMAL HIGH (ref 100–199)
HDL: 78 mg/dL (ref >39)
LDL Chol Calc (NIH): 139 mg/dL — ABNORMAL HIGH (ref 0–99)
Triglycerides: 248 mg/dL — ABNORMAL HIGH (ref 0–149)
VLDL Cholesterol Cal: 44 mg/dL — ABNORMAL HIGH (ref 5–40)

## 2020-12-08 LAB — TSH+T4F+T3FREE
Free T4: 1.5 ng/dL (ref 0.82–1.77)
T3, Free: 3 pg/mL (ref 2.0–4.4)
TSH: 3.03 u[IU]/mL (ref 0.450–4.500)

## 2020-12-08 LAB — PRO B NATRIURETIC PEPTIDE: NT-Pro BNP: 158 pg/mL (ref 0–376)

## 2020-12-08 NOTE — Telephone Encounter (Signed)
-----   Message from Brian J Munley, MD sent at 12/08/2020 11:33 AM EDT ----- CMP thyroid studies are normal  His lipids are elevated I think he should take his statin and I would like to try him on a low-dose of a low intensity pravastatin 20 mg daily with the evening meal. 

## 2020-12-08 NOTE — Telephone Encounter (Signed)
Tried calling patient. No answer and no voicemail set up for me to leave a message. 

## 2020-12-09 ENCOUNTER — Telehealth: Payer: Self-pay

## 2020-12-09 NOTE — Telephone Encounter (Signed)
Tried calling patient. No answer and no voicemail set up for me to leave a message. 

## 2020-12-09 NOTE — Telephone Encounter (Signed)
-----   Message from Brian J Munley, MD sent at 12/08/2020 11:33 AM EDT ----- CMP thyroid studies are normal  His lipids are elevated I think he should take his statin and I would like to try him on a low-dose of a low intensity pravastatin 20 mg daily with the evening meal. 

## 2020-12-10 ENCOUNTER — Telehealth: Payer: Self-pay

## 2020-12-10 MED ORDER — PRAVASTATIN SODIUM 20 MG PO TABS: 20.0000 mg | ORAL_TABLET | Freq: Every evening | ORAL | 3 refills | Status: DC

## 2020-12-10 NOTE — Telephone Encounter (Signed)
-----   Message from Baldo Daub, MD sent at 12/08/2020 11:33 AM EDT ----- CMP thyroid studies are normal  His lipids are elevated I think he should take his statin and I would like to try him on a low-dose of a low intensity pravastatin 20 mg daily with the evening meal.

## 2020-12-10 NOTE — Telephone Encounter (Signed)
Spoke with patient regarding results and recommendation.  Patient verbalizes understanding and is agreeable to plan of care. Advised patient to call back with any issues or concerns.  

## 2021-01-24 ENCOUNTER — Other Ambulatory Visit: Payer: Self-pay | Admitting: Cardiology

## 2021-02-22 ENCOUNTER — Other Ambulatory Visit: Payer: Self-pay | Admitting: Cardiology

## 2021-04-21 ENCOUNTER — Other Ambulatory Visit: Payer: Self-pay | Admitting: Cardiology

## 2021-05-20 ENCOUNTER — Other Ambulatory Visit: Payer: Self-pay | Admitting: Cardiology

## 2021-05-20 NOTE — Telephone Encounter (Signed)
Prescription refill request for Eliquis received. Indication:Afib Last office visit:9/22 Scr:1.0 Age: 71 Weight:98.1 kg  Prescription refilled

## 2021-06-14 ENCOUNTER — Other Ambulatory Visit: Payer: Self-pay

## 2021-06-14 ENCOUNTER — Ambulatory Visit (INDEPENDENT_AMBULATORY_CARE_PROVIDER_SITE_OTHER): Payer: Self-pay | Admitting: Cardiology

## 2021-06-14 ENCOUNTER — Encounter: Payer: Self-pay | Admitting: Cardiology

## 2021-06-14 VITALS — BP 138/88 | HR 70 | Ht 70.0 in | Wt 223.0 lb

## 2021-06-14 DIAGNOSIS — Z79899 Other long term (current) drug therapy: Secondary | ICD-10-CM

## 2021-06-14 DIAGNOSIS — I480 Paroxysmal atrial fibrillation: Secondary | ICD-10-CM

## 2021-06-14 DIAGNOSIS — I5022 Chronic systolic (congestive) heart failure: Secondary | ICD-10-CM

## 2021-06-14 DIAGNOSIS — Z7901 Long term (current) use of anticoagulants: Secondary | ICD-10-CM

## 2021-06-14 MED ORDER — PRAVASTATIN SODIUM 20 MG PO TABS: 20.0000 mg | ORAL_TABLET | Freq: Every evening | ORAL | 1 refills | Status: DC

## 2021-06-14 MED ORDER — POTASSIUM CHLORIDE CRYS ER 20 MEQ PO TBCR: 20.0000 meq | EXTENDED_RELEASE_TABLET | Freq: Every day | ORAL | 1 refills | Status: DC

## 2021-06-14 MED ORDER — SACUBITRIL-VALSARTAN 97-103 MG PO TABS: 1.0000 | ORAL_TABLET | Freq: Two times a day (BID) | ORAL | 1 refills | Status: DC

## 2021-06-14 MED ORDER — AMIODARONE HCL 200 MG PO TABS: 200.0000 mg | ORAL_TABLET | Freq: Every day | ORAL | 1 refills | Status: DC

## 2021-06-14 MED ORDER — FUROSEMIDE 40 MG PO TABS
40.0000 mg | ORAL_TABLET | Freq: Two times a day (BID) | ORAL | 1 refills | Status: DC
Start: 2021-06-14 — End: 2022-03-09

## 2021-06-14 MED ORDER — ELIQUIS 5 MG PO TABS: 5.0000 mg | ORAL_TABLET | Freq: Two times a day (BID) | ORAL | 1 refills | Status: DC

## 2021-06-14 MED ORDER — METOPROLOL TARTRATE 25 MG PO TABS: 25.0000 mg | ORAL_TABLET | Freq: Two times a day (BID) | ORAL | 1 refills | Status: DC

## 2021-06-14 NOTE — Progress Notes (Signed)
?Cardiology Office Note:   ? ?Date:  06/14/2021  ? ?ID:  John Ingram, DOB 06-10-1950, MRN [id number] ? ?PCP:  Eloisa Northern, MD  ?Cardiologist:  Norman Herrlich, MD   ? ?Referring MD: Eloisa Northern, MD  ? ? ?ASSESSMENT:   ? ?1. Paroxysmal atrial fibrillation (HCC)   ?2. On amiodarone therapy   ?3. Chronic anticoagulation   ?4. Chronic systolic heart failure (HCC)   ? ?PLAN:   ? ?In order of problems listed above: ? ?John Ingram continues to do well with his atrial fibrillation maintaining sinus rhythm on low-dose amiodarone.  Because of his underlying sarcoidosis I will check a yearly chest x-ray to screen but no indication of lung toxicity.  Also check for lung liver and thyroid toxicity ?Continue amiodarone anticoagulant ?Compensated no fluid overload continue current treatment including diuretic beta-blocker high-dose Entresto check proBNP ?I asked him to consider EP consultation he declines ? ? ?Next appointment: 6 months ? ? ?Medication Adjustments/Labs and Tests Ordered: ?Current medicines are reviewed at length with the patient today.  Concerns regarding medicines are outlined above.  ?No orders of the defined types were placed in this encounter. ? ?No orders of the defined types were placed in this encounter. ? ? ?Chief Complaint  ?Patient presents with  ? Follow-up  ? Cardiomyopathy  ?  With sarcoidosis  ? Congestive Heart Failure  ? Atrial Fibrillation  ? ? ?History of Present Illness:   ? ?John Ingram is a 71 y.o. 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  With concerns of infiltrative cardiomyopathy he underwent cardiac MRI 06/01/2020 showing EF of 44% with basal anteroseptal and inferolateral hypokinesia normal right ventricular size and function EF 54% he did have some late gadolinium enhancement with the recommendation of evaluation for coronary artery disease.  When seen 06/02/2020 he was improved on guideline  directed therapy including amiodarone to maintain sinus rhythm with paroxysmal atrial fibrillation anticoagulation with Eliquis and Entresto therapy.  He declined an ischemia evaluation with cardiac CTA at that time.  ? ?Compliance with diet, lifestyle and medications: Yes ? ?He has done well with guideline directed medical therapy he is not having shortness of breath edema chest pain palpitation or syncope. ?No indication is having atrial fibrillation I asked him to purchase a Fitbit smart watch to screen himself for bradycardia and atrial fibrillation recurrence ?He tolerates his anticoagulant without bleeding ?No muscle pain or weakness from his ?Past Medical History:  ?Diagnosis Date  ? Acute systolic heart failure (HCC) 04/13/2020  ? Alcohol abuse 04/13/2020  ? Chest pain at rest 04/13/2020  ? CHF exacerbation (HCC) 04/13/2020  ? History of sarcoidosis 04/13/2020  ? Hypertensive heart disease 04/13/2020  ? Hypertensive urgency 04/16/2020  ? Hypokalemia 04/13/2020  ? Noncompliance 04/13/2020  ? Paroxysmal A-fib (HCC) 04/13/2020  ? Pericardial effusion 04/13/2020  ? Pleural effusion 04/13/2020  ? Sarcoidosis 04/13/2020  ? Shortness of breath 04/13/2020  ? ? ?Past Surgical History:  ?Procedure Laterality Date  ? BRONCHOSCOPY    ? COLONOSCOPY    ? X 2  ? TONSILLECTOMY    ? WISDOM TOOTH EXTRACTION    ? ? ?Current Medications: ?Current Meds  ?Medication Sig  ? amiodarone (PACERONE) 200 MG tablet Take 1 tablet by mouth once daily  ? ELIQUIS 5 MG TABS tablet Take 1 tablet by mouth twice daily  ? furosemide (LASIX) 40 MG tablet Take 1 tablet by mouth twice daily  ?  metoprolol tartrate (LOPRESSOR) 25 MG tablet Take 1 tablet by mouth twice daily  ? potassium chloride SA (KLOR-CON M) 20 MEQ tablet Take 1 tablet by mouth once daily  ? pravastatin (PRAVACHOL) 20 MG tablet Take 1 tablet (20 mg total) by mouth every evening.  ? sacubitril-valsartan (ENTRESTO) 97-103 MG Take 1 tablet by mouth 2 (two) times daily.  ? thiamine 100 MG tablet  Take 100 mg by mouth daily.  ?  ? ?Allergies:   Patient has no known allergies.  ? ?Social History  ? ?Socioeconomic History  ? Marital status: Single  ?  Spouse name: Not on file  ? Number of children: Not on file  ? Years of education: Not on file  ? Highest education level: Not on file  ?Occupational History  ? Not on file  ?Tobacco Use  ? Smoking status: Never  ?  Passive exposure: Never  ? Smokeless tobacco: Never  ?Vaping Use  ? Vaping Use: Never used  ?Substance and Sexual Activity  ? Alcohol use: Not Currently  ? Drug use: Never  ? Sexual activity: Not on file  ?Other Topics Concern  ? Not on file  ?Social History Narrative  ? Not on file  ? ?Social Determinants of Health  ? ?Financial Resource Strain: Not on file  ?Food Insecurity: Not on file  ?Transportation Needs: Not on file  ?Physical Activity: Not on file  ?Stress: Not on file  ?Social Connections: Not on file  ?  ? ?Family History: ?The patient's family history includes Aneurysm in his mother; Hypertension in his mother; Pancreatic cancer in his father; Prostate cancer in his father; Skin cancer in his paternal grandfather. ?ROS:   ?Please see the history of present illness.    ?All other systems reviewed and are negative. ? ?EKGs/Labs/Other Studies Reviewed:   ? ?The following studies were reviewed today: ? ?EKG:  EKG ordered today and personally reviewed.  The ekg ordered today demonstrates sinus rhythm first-degree AV block otherwise normal EKG ? ?Recent Labs: ?12/07/2020: ALT 15; BUN 18; Creatinine, Ser 1.00; NT-Pro BNP 158; Potassium 4.2; Sodium 140; TSH 3.030  ?Recent Lipid Panel ?   ?Component Value Date/Time  ? CHOL 261 (H) 12/07/2020 1442  ? TRIG 248 (H) 12/07/2020 1442  ? HDL 78 12/07/2020 1442  ? CHOLHDL 3.3 12/07/2020 1442  ? LDLCALC 139 (H) 12/07/2020 1442  ? ? ?Physical Exam:   ? ?VS:  BP 138/88 (BP Location: Right Arm)   Pulse 70   Ht 5\' 10"  (1.778 m)   Wt 223 lb (101.2 kg)   SpO2 98%   BMI 32.00 kg/m?    ? ?Wt Readings from Last 3  Encounters:  ?06/14/21 223 lb (101.2 kg)  ?12/07/20 216 lb 3.2 oz (98.1 kg)  ?06/02/20 210 lb (95.3 kg)  ?  ? ?GEN:  Well nourished, well developed in no acute distress he does not look chronically ill or debilitated ?HEENT: Normal ?NECK: No JVD; No carotid bruits ?LYMPHATICS: No lymphadenopathy ?CARDIAC: RRR, no murmurs, rubs, gallops ?RESPIRATORY:  Clear to auscultation without rales, wheezing or rhonchi  ?ABDOMEN: Soft, non-tender, non-distended ?MUSCULOSKELETAL:  No edema; No deformity  ?SKIN: Warm and dry ?NEUROLOGIC:  Alert and oriented x 3 ?PSYCHIATRIC:  Normal affect  ? ? ?Signed, ?08/02/20, MD  ?06/14/2021 2:29 PM    ?[facility]  ?

## 2021-06-14 NOTE — Patient Instructions (Signed)
Medication Instructions:  ?Your physician recommends that you continue on your current medications as directed. Please refer to the Current Medication list given to you today. ? ?*If you need a refill on your cardiac medications before your next appointment, please call your pharmacy* ? ? ?Lab Work: ?Your physician recommends that you return for lab work in: Today for CMP, TSH, T3, T4 free, ProBnp ? ?If you have labs (blood work) drawn today and your tests are completely normal, you will receive your results only by: ?MyChart Message (if you have MyChart) OR ?A paper copy in the mail ?If you have any lab test that is abnormal or we need to change your treatment, we will call you to review the results. ? ? ?Testing/Procedures: ?CXR at [facility] ? ? ?Follow-Up: ?At [facility], you and your health needs are our priority.  As part of our continuing mission to provide you with exceptional heart care, we have created designated Provider Care Teams.  These Care Teams include your primary Cardiologist (physician) and Advanced Practice Providers (APPs -  Physician Assistants and Nurse Practitioners) who all work together to provide you with the care you need, when you need it. ? ?We recommend signing up for the patient portal called "MyChart".  Sign up information is provided on this After Visit Summary.  MyChart is used to connect with patients for Virtual Visits (Telemedicine).  Patients are able to view lab/test results, encounter notes, upcoming appointments, etc.  Non-urgent messages can be sent to your provider as well.   ?To learn more about what you can do with MyChart, go to [email or web address].   ? ?Your next appointment:   ?6 month(s) ? ?The format for your next appointment:   ?In Person ? ?Provider:   ?Norman Herrlich, MD  ? ? ?Other Instructions ?Recommend FitBit to monitor Atrial Fibrillation  ?

## 2021-06-14 NOTE — Addendum Note (Signed)
Addended by: Jerl Santos R on: 06/14/2021 02:43 PM ? ? Modules accepted: Orders ? ?

## 2021-06-14 NOTE — Addendum Note (Signed)
Addended by: Roosvelt Harps R on: 06/14/2021 02:53 PM ? ? Modules accepted: Orders ? ?

## 2021-06-15 ENCOUNTER — Telehealth: Payer: Self-pay

## 2021-06-15 LAB — COMPREHENSIVE METABOLIC PANEL
ALT: 20 IU/L (ref 0–44)
AST: 26 IU/L (ref 0–40)
Albumin/Globulin Ratio: 2 (ref 1.2–2.2)
Albumin: 4.3 g/dL (ref 3.8–4.8)
Alkaline Phosphatase: 95 IU/L (ref 44–121)
BUN/Creatinine Ratio: 15 (ref 10–24)
BUN: 18 mg/dL (ref 8–27)
Bilirubin Total: 0.5 mg/dL (ref 0.0–1.2)
CO2: 27 mmol/L (ref 20–29)
Calcium: 9.3 mg/dL (ref 8.6–10.2)
Chloride: 100 mmol/L (ref 96–106)
Creatinine, Ser: 1.17 mg/dL (ref 0.76–1.27)
Globulin, Total: 2.1 g/dL (ref 1.5–4.5)
Glucose: 95 mg/dL (ref 70–99)
Potassium: 4.7 mmol/L (ref 3.5–5.2)
Sodium: 141 mmol/L (ref 134–144)
Total Protein: 6.4 g/dL (ref 6.0–8.5)
eGFR: 67 mL/min/{1.73_m2} (ref >59)

## 2021-06-15 LAB — T4, FREE: Free T4: 1.71 ng/dL (ref 0.82–1.77)

## 2021-06-15 LAB — TSH: TSH: 3.95 u[IU]/mL (ref 0.450–4.500)

## 2021-06-15 LAB — PRO B NATRIURETIC PEPTIDE: NT-Pro BNP: 184 pg/mL (ref 0–376)

## 2021-06-15 LAB — T3, FREE: T3, Free: 2.8 pg/mL (ref 2.0–4.4)

## 2021-06-15 NOTE — Telephone Encounter (Signed)
-----   Message from Baldo Daub, MD sent at 06/15/2021  7:52 AM EDT ----- ?All results are good including his BNP level for his congestive heart failure. ?

## 2021-06-15 NOTE — Telephone Encounter (Signed)
Patient notified of results.

## 2021-06-30 ENCOUNTER — Telehealth: Payer: Self-pay

## 2021-06-30 NOTE — Telephone Encounter (Signed)
No VM setup. ?CXR is good per B[facility]. ?

## 2021-07-01 NOTE — Telephone Encounter (Signed)
Patient returning call.

## 2021-07-01 NOTE — Telephone Encounter (Signed)
No vm set up. 

## 2021-07-01 NOTE — Telephone Encounter (Signed)
Results reviewed with pt as per Dr. Munley's note.  Pt verbalized understanding and had no additional questions. Routed to PCP  

## 2021-12-22 ENCOUNTER — Ambulatory Visit: Payer: Self-pay | Attending: Cardiology | Admitting: Cardiology

## 2021-12-22 ENCOUNTER — Encounter: Payer: Self-pay | Admitting: Cardiology

## 2021-12-22 VITALS — BP 138/80 | HR 72 | Ht 70.0 in | Wt 225.0 lb

## 2021-12-22 DIAGNOSIS — Z862 Personal history of diseases of the blood and blood-forming organs and certain disorders involving the immune mechanism: Secondary | ICD-10-CM

## 2021-12-22 DIAGNOSIS — Z79899 Other long term (current) drug therapy: Secondary | ICD-10-CM

## 2021-12-22 DIAGNOSIS — I480 Paroxysmal atrial fibrillation: Secondary | ICD-10-CM

## 2021-12-22 DIAGNOSIS — Z7901 Long term (current) use of anticoagulants: Secondary | ICD-10-CM

## 2021-12-22 MED ORDER — AMIODARONE HCL 200 MG PO TABS: 200.0000 mg | ORAL_TABLET | ORAL | 3 refills | Status: DC

## 2021-12-22 NOTE — Patient Instructions (Addendum)
Medication Instructions:  Your physician has recommended you make the following change in your medication:   START: Amiodarone 200 mg every other day  *If you need a refill on your cardiac medications before your next appointment, please call your pharmacy*   Lab Work: Your physician recommends that you return for lab work in:   Labs today: CMP,TSH T3 T4  If you have labs (blood work) drawn today and your tests are completely normal, you will receive your results only by: [place] (if you have [place]) OR A paper copy in the mail If you have any lab test that is abnormal or we need to change your treatment, we will call you to review the results.   Testing/Procedures: None   Follow-Up: At [facility], you and your health needs are our priority.  As part of our continuing mission to provide you with exceptional heart care, we have created designated Provider Care Teams.  These Care Teams include your primary Cardiologist (physician) and Advanced Practice Providers (APPs -  Physician Assistants and Nurse Practitioners) who all work together to provide you with the care you need, when you need it.  We recommend signing up for the patient portal called "MyChart".  Sign up information is provided on this After Visit Summary.  MyChart is used to connect with patients for Virtual Visits (Telemedicine).  Patients are able to view lab/test results, encounter notes, upcoming appointments, etc.  Non-urgent messages can be sent to your provider as well.   To learn more about what you can do with MyChart, go to [email or web address].    Your next appointment:   6 month(s)  The format for your next appointment:   In Person  Provider:   Shirlee More, MD    Other Instructions Buy an [employer] blood pressure device and record your blood pressures at home  Consider a Samsung or FitBit watch  Important Information About Sugar

## 2021-12-22 NOTE — Progress Notes (Signed)
Cardiology Office Note:    Date:  12/22/2021   ID:  John Ingram, DOB 04-22-1950, MRN [id number]  PCP:  Garwin Brothers, MD  Cardiologist:  Shirlee More, MD    Referring MD: Garwin Brothers, MD    ASSESSMENT:    1. Paroxysmal atrial fibrillation (HCC)   2. On amiodarone therapy   3. Chronic anticoagulation   4. History of sarcoidosis    PLAN:    In order of problems listed above:  Continues to do well maintaining sinus rhythm low-dose amiodarone in view of his lung disease reduced to the minimum effective dose 100 mg daily he will continue his anticoagulant I encouraged him to purchase a smart watch to monitor his heart rhythm Recheck CMP and thyroid studies no apparent toxicity   Next appointment: 6 months consider repeat echocardiogram around that time   Medication Adjustments/Labs and Tests Ordered: Current medicines are reviewed at length with the patient today.  Concerns regarding medicines are outlined above.  Orders Placed This Encounter  Procedures   Comp Met (CMET)   TSH+T4F+T3Free   EKG 12-Lead   Meds ordered this encounter  Medications   amiodarone (PACERONE) 200 MG tablet    Sig: Take 1 tablet (200 mg total) by mouth every other day.    Dispense:  45 tablet    Refill:  3    Chief Complaint  Patient presents with   Follow-up   Atrial Fibrillation    History of Present Illness:    John Ingram is a 71 y.o. male with a hx of pulmonary sarcoidosis heart failure initially ejection fraction 30% most recently 44% on cardiac MRI pulmonary artery hypertension and chronic anticoagulation last seen 06/24/2021.  Compliance with diet, lifestyle and medications: Yes  He has had nothing to suggest recurrent atrial fibrillation I encouraged him to purchase a smart watch to self monitor at home And tolerates amiodarone without side effect and his anticoagulant without bleeding He feels better and is not having edema shortness of breath chest pain palpitation or  syncope Past Medical History:  Diagnosis Date   Acute systolic heart failure ([place]) 04/13/2020   Alcohol abuse 04/13/2020   Chest pain at rest 04/13/2020   CHF exacerbation ([place]) 04/13/2020   History of sarcoidosis 04/13/2020   Hypertensive heart disease 04/13/2020   Hypertensive urgency 04/16/2020   Hypokalemia 04/13/2020   Noncompliance 04/13/2020   Paroxysmal A-fib ([place]) 04/13/2020   Pericardial effusion 04/13/2020   Pleural effusion 04/13/2020   Sarcoidosis 04/13/2020   Shortness of breath 04/13/2020    Past Surgical History:  Procedure Laterality Date   BRONCHOSCOPY     COLONOSCOPY     X 2   TONSILLECTOMY     WISDOM TOOTH EXTRACTION      Current Medications: Current Meds  Medication Sig   amiodarone (PACERONE) 200 MG tablet Take 1 tablet (200 mg total) by mouth every other day.   ELIQUIS 5 MG TABS tablet Take 1 tablet (5 mg total) by mouth 2 (two) times daily.   furosemide (LASIX) 40 MG tablet Take 1 tablet (40 mg total) by mouth 2 (two) times daily.   metoprolol tartrate (LOPRESSOR) 25 MG tablet Take 1 tablet (25 mg total) by mouth 2 (two) times daily.   potassium chloride SA (KLOR-CON M) 20 MEQ tablet Take 1 tablet (20 mEq total) by mouth daily.   pravastatin (PRAVACHOL) 20 MG tablet Take 1 tablet (20 mg total) by mouth every evening.   sacubitril-valsartan (ENTRESTO) 97-103 MG Take 1  tablet by mouth 2 (two) times daily.   thiamine 100 MG tablet Take 100 mg by mouth daily.   [DISCONTINUED] amiodarone (PACERONE) 200 MG tablet Take 1 tablet (200 mg total) by mouth daily.     Allergies:   Patient has no known allergies.   Social History   Socioeconomic History   Marital status: Single    Spouse name: Not on file   Number of children: Not on file   Years of education: Not on file   Highest education level: Not on file  Occupational History   Not on file  Tobacco Use   Smoking status: Never    Passive exposure: Never   Smokeless tobacco: Never  Vaping Use   Vaping Use:  Never used  Substance and Sexual Activity   Alcohol use: Not Currently   Drug use: Never   Sexual activity: Not on file  Other Topics Concern   Not on file  Social History Narrative   Not on file   Social Determinants of Health   Financial Resource Strain: Not on file  Food Insecurity: Not on file  Transportation Needs: Not on file  Physical Activity: Not on file  Stress: Not on file  Social Connections: Not on file     Family History: The patient's family history includes Aneurysm in his mother; Hypertension in his mother; Pancreatic cancer in his father; Prostate cancer in his father; Skin cancer in his paternal grandfather. ROS:   Please see the history of present illness.    All other systems reviewed and are negative.  EKGs/Labs/Other Studies Reviewed:    The following studies were reviewed today:  EKG:  EKG ordered today and personally reviewed.  The ekg ordered today demonstrates sinus rhythm 1 APC first-degree AV block  Recent Labs: 06/14/2021: ALT 20; BUN 18; Creatinine, Ser 1.17; NT-Pro BNP 184; Potassium 4.7; Sodium 141; TSH 3.950  Recent Lipid Panel    Component Value Date/Time   CHOL 261 (H) 12/07/2020 1442   TRIG 248 (H) 12/07/2020 1442   HDL 78 12/07/2020 1442   CHOLHDL 3.3 12/07/2020 1442   LDLCALC 139 (H) 12/07/2020 1442    Physical Exam:    VS:  BP 138/80   Pulse 72   Ht 5' 10"  (1.778 m)   Wt 225 lb (102.1 kg)   SpO2 99%   BMI 32.28 kg/m     Wt Readings from Last 3 Encounters:  12/22/21 225 lb (102.1 kg)  06/14/21 223 lb (101.2 kg)  12/07/20 216 lb 3.2 oz (98.1 kg)     GEN:  Well nourished, well developed in no acute distress HEENT: Normal NECK: No JVD; No carotid bruits LYMPHATICS: No lymphadenopathy CARDIAC: RRR, no murmurs, rubs, gallops RESPIRATORY:  Clear to auscultation without rales, wheezing or rhonchi  ABDOMEN: Soft, non-tender, non-distended MUSCULOSKELETAL:  No edema; No deformity  SKIN: Warm and dry NEUROLOGIC:  Alert  and oriented x 3 PSYCHIATRIC:  Normal affect    Signed, Shirlee More, MD  12/22/2021 3:05 PM    [facility]

## 2021-12-23 LAB — COMPREHENSIVE METABOLIC PANEL
ALT: 33 IU/L (ref 0–44)
AST: 31 IU/L (ref 0–40)
Albumin/Globulin Ratio: 1.9 (ref 1.2–2.2)
Albumin: 4.2 g/dL (ref 3.9–4.9)
Alkaline Phosphatase: 122 IU/L — ABNORMAL HIGH (ref 44–121)
BUN/Creatinine Ratio: 19 (ref 10–24)
BUN: 19 mg/dL (ref 8–27)
Bilirubin Total: 0.5 mg/dL (ref 0.0–1.2)
CO2: 25 mmol/L (ref 20–29)
Calcium: 9.3 mg/dL (ref 8.6–10.2)
Chloride: 100 mmol/L (ref 96–106)
Creatinine, Ser: 1 mg/dL (ref 0.76–1.27)
Globulin, Total: 2.2 g/dL (ref 1.5–4.5)
Glucose: 104 mg/dL — ABNORMAL HIGH (ref 70–99)
Potassium: 4 mmol/L (ref 3.5–5.2)
Sodium: 143 mmol/L (ref 134–144)
Total Protein: 6.4 g/dL (ref 6.0–8.5)
eGFR: 81 mL/min/{1.73_m2} (ref >59)

## 2021-12-23 LAB — TSH+T4F+T3FREE
Free T4: 1.65 ng/dL (ref 0.82–1.77)
T3, Free: 3.1 pg/mL (ref 2.0–4.4)
TSH: 3.62 u[IU]/mL (ref 0.450–4.500)

## 2022-01-11 ENCOUNTER — Other Ambulatory Visit: Payer: Self-pay | Admitting: Cardiology

## 2022-03-09 ENCOUNTER — Other Ambulatory Visit: Payer: Self-pay | Admitting: Cardiology

## 2022-03-09 NOTE — Telephone Encounter (Signed)
Refills sent to pharmacy. 

## 2022-06-08 ENCOUNTER — Other Ambulatory Visit: Payer: Self-pay | Admitting: Cardiology

## 2022-06-13 IMAGING — MR MR CARD MORPHOLOGY WO/W CM
45 of 48 series · 45 of 48 positions shown · IV contrast (Contrast agent)
Comparison: none

CLINICAL DATA: Assess for cardiac sarcoidosis

EXAM:
CARDIAC MRI
TECHNIQUE: The patient was scanned on a 1.5 Tesla GE magnet. A dedicated
cardiac coil was used. Functional imaging was done using Fiesta
sequences. [DATE], and 4 chamber views were done to assess for RWMA's.
Modified Amaka rule using a short axis stack was used to
calculate an ejection fraction on a dedicated work station using
[employer] software. The patient received 8 cc of Gadavist. After 10
minutes inversion recovery sequences were used to assess for
infiltration and scar tissue.

[Series 4: [id number] · axial · 8.0mm · 1.41mm/px · 1 of 18 slices shown]
[im 1/18]
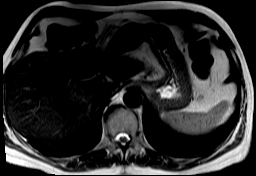

[Series 8: bSSFP · oblique · 8.0mm · 1.61mm/px · 1 of 25 slices shown (1 of 23)]
[im 1/25]
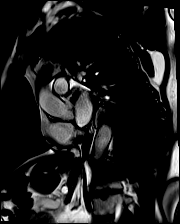

[Series 9: bSSFP · oblique · 8.0mm · 1.61mm/px · 1 of 25 slices shown (2 of 23)]
[im 1/25]
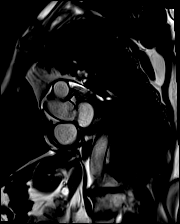

[Series 10: bSSFP · oblique · 8.0mm · 1.61mm/px · 1 of 25 slices shown (3 of 23)]
[im 1/25]
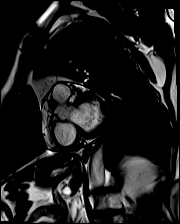

[Series 11: bSSFP · oblique · 8.0mm · 1.61mm/px · 1 of 25 slices shown (4 of 23)]
[im 1/25]
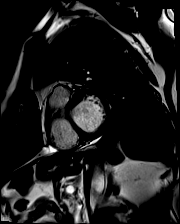

[Series 12: bSSFP · oblique · 8.0mm · 1.61mm/px · 1 of 25 slices shown (5 of 23)]
[im 1/25]
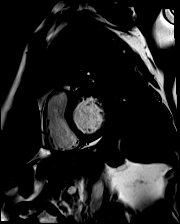

[Series 13: bSSFP · oblique · 8.0mm · 1.61mm/px · 1 of 25 slices shown (6 of 23)]
[im 1/25]
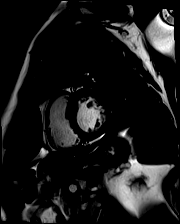

[Series 14: bSSFP · oblique · 8.0mm · 1.61mm/px · 1 of 25 slices shown (7 of 23)]
[im 1/25]
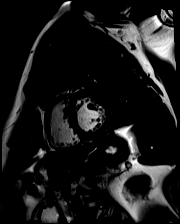

[Series 15: bSSFP · oblique · 8.0mm · 1.61mm/px · 1 of 25 slices shown (8 of 23)]
[im 1/25]
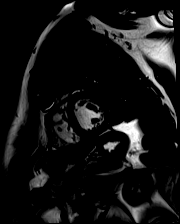

[Series 16: bSSFP · oblique · 8.0mm · 1.61mm/px · 1 of 25 slices shown (9 of 23)]
[im 1/25]
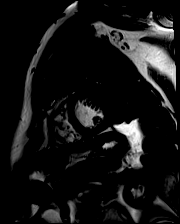

[Series 17: bSSFP · oblique · 8.0mm · 1.61mm/px · 1 of 25 slices shown (10 of 23)]
[im 1/25]
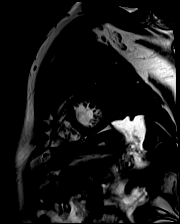

[Series 18: bSSFP · oblique · 8.0mm · 1.61mm/px · 1 of 25 slices shown (11 of 23)]
[im 1/25]
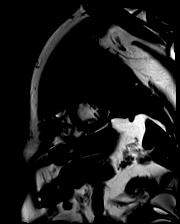

[Series 19: bSSFP · oblique · 8.0mm · 1.61mm/px · 1 of 25 slices shown (12 of 23)]
[im 1/25]
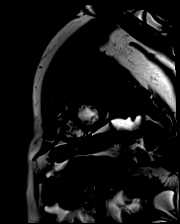

[Series 20: bSSFP · oblique · 8.0mm · 1.61mm/px · 1 of 25 slices shown (13 of 23)]
[im 1/25]
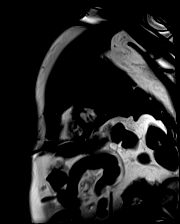

[Series 21: bSSFP · oblique · 8.0mm · 1.61mm/px · 1 of 25 slices shown (14 of 23)]
[im 1/25]
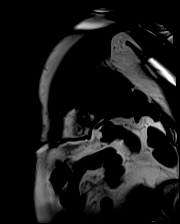

[Series 22: bSSFP · oblique · 8.0mm · 1.61mm/px · 1 of 25 slices shown (15 of 23)]
[im 1/25]
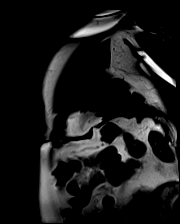

[Series 23: bSSFP · oblique · 8.0mm · 1.61mm/px · 1 of 25 slices shown (16 of 23)]
[im 1/25]
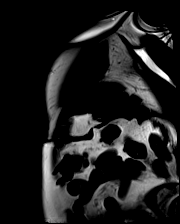

[Series 24: bSSFP · oblique · 8.0mm · 1.61mm/px · 1 of 25 slices shown (17 of 23)]
[im 1/25]
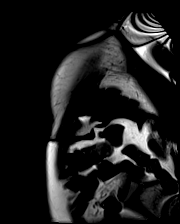

[Series 25: STIR · oblique · 8.0mm · 1.92mm/px · 1 of 17 slices shown]
[im 1/17]
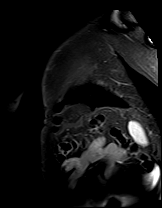

[Series 26: bSSFP · oblique · 6.0mm · 1.48mm/px · 1 of 25 slices shown (18 of 23)]
[im 1/25]
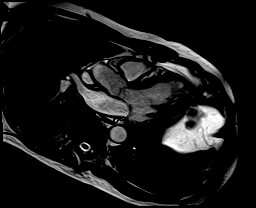

[Series 27: bSSFP · oblique · 6.0mm · 1.41mm/px · 1 of 25 slices shown (19 of 23)]
[im 1/25]
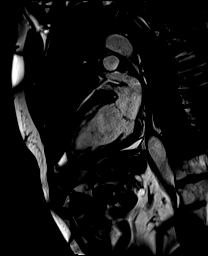

[Series 28: bSSFP · oblique · 6.0mm · 1.41mm/px · 1 of 25 slices shown (20 of 23)]
[im 1/25]
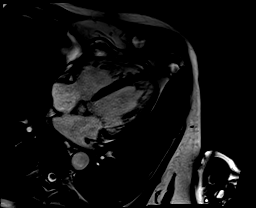

[Series 29: (id)_long_t1 · oblique · 8.0mm · 1.56mm/px · 1 of 24 slices shown]
[im 1/24]
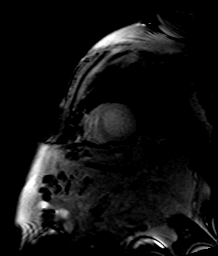

[Series 30: (id)_long_t1_moco · oblique · 8.0mm · 1.56mm/px · 1 of 24 slices shown]
[im 1/24]
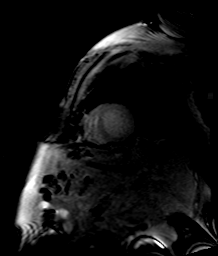

[Series 33: (id)_trufi · oblique · 8.0mm · 2.08mm/px · 1 of 9 slices shown]
[im 1/9]
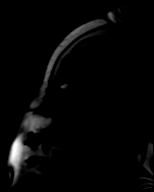

[Series 34: (id)_trufi_moco · oblique · 8.0mm · 2.08mm/px · 1 of 9 slices shown]
[im 1/9]
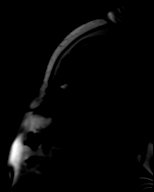

[Series 35: (id)_trufi_moco_t2 · oblique · 8.0mm · 2.08mm/px · 1 of 3 slices shown]
[im 1/3]
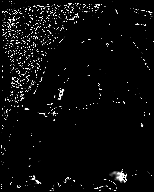

[Series 37: pre short axis · oblique · non-contrast · 8.0mm · 2.25mm/px · 1 of 10 slices shown (1 of 6)]
[im 1/10]
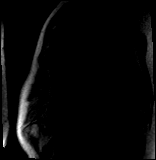

[Series 38: pre short axis · oblique · non-contrast · 8.0mm · 2.25mm/px · 1 of 10 slices shown (2 of 6)]
[im 1/10]
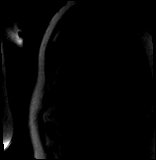

[Series 39: pre short axis · oblique · non-contrast · 8.0mm · 2.25mm/px · 1 of 10 slices shown (3 of 6)]
[im 1/10]
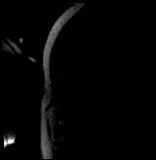

[Series 40: pre short axis · oblique · non-contrast · 8.0mm · 2.25mm/px · 1 of 10 slices shown (4 of 6)]
[im 1/10]
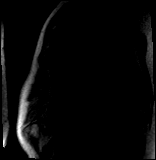

[Series 41: pre short axis · oblique · non-contrast · 8.0mm · 2.25mm/px · 1 of 10 slices shown (5 of 6)]
[im 1/10]
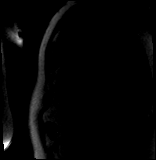

[Series 42: pre short axis · oblique · non-contrast · 8.0mm · 2.25mm/px · 1 of 10 slices shown (6 of 6)]
[im 1/10]
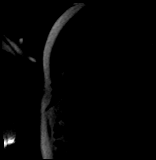

[Series 46: rest short axis · oblique · 8.0mm · 2.38mm/px · 1 of 60 slices shown (1 of 6)]
[im 1/60]
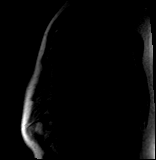

[Series 47: rest short axis · oblique · 8.0mm · 2.38mm/px · 1 of 60 slices shown (2 of 6)]
[im 1/60]
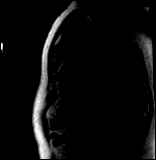

[Series 48: rest short axis · oblique · 8.0mm · 2.38mm/px · 1 of 60 slices shown (3 of 6)]
[im 1/60]
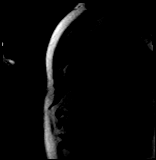

[Series 49: rest short axis · oblique · 8.0mm · 2.38mm/px · 1 of 60 slices shown (4 of 6)]
[im 1/60]
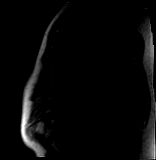

[Series 50: rest short axis · oblique · 8.0mm · 2.38mm/px · 1 of 60 slices shown (5 of 6)]
[im 1/60]
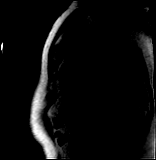

[Series 51: rest short axis · oblique · 8.0mm · 2.38mm/px · 1 of 60 slices shown (6 of 6)]
[im 1/60]
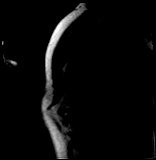

[Series 52: bSSFP · oblique · 8.0mm · 1.92mm/px · 1 of 17 slices shown (21 of 23)]
[im 1/17]
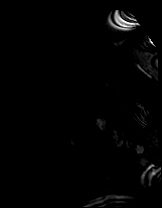

[Series 53: bSSFP · oblique · 8.0mm · 1.92mm/px · 1 of 17 slices shown (22 of 23)]
[im 1/17]
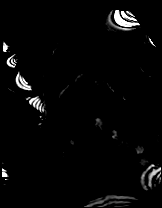

[Series 54: bSSFP · coronal · 6.0mm · 1.41mm/px · 1 of 25 slices shown (23 of 23)]
[im 1/25]
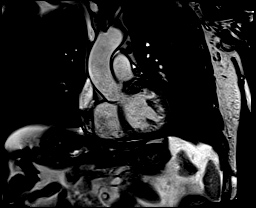

[Series 56: cine rvit · coronal · 6.0mm · 1.41mm/px · 1 of 25 slices shown]
[im 1/25]
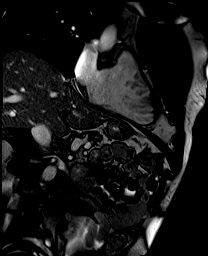

[Series 57: aortic valve cine · oblique · 6.0mm · 1.41mm/px · 1 of 25 slices shown]
[im 1/25]
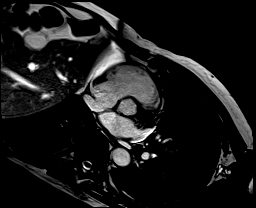

[Series 58: cine rvot · sagittal · 6.0mm · 1.41mm/px · 1 of 25 slices shown]
[im 1/25]
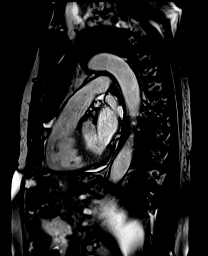

[45 of 48 positions shown; findings below may reference images not displayed]

FINDINGS: Limited images of the lung fields show no gross abnormalities.

Normal left ventricular size and wall thickness. Mild basal
anteroseptal and basal inferolateral hypokinesis. Normal right
ventricular size and systolic function, EF 54%. Mild left atrial
enlargement. Normal right atrium. Mild mitral regurgitation.
Trileaflet aortic valve with no stenosis or regurgitation noted.

On delayed enhancement images, there is mid-wall late gadolinium
enhancement (LGE) in the basal anteroseptal wall and subendocardial
to mid-wall LGE in the basal to mid inferolateral wall. There is
mid-wall LGE in the basal inferoseptal wall at the RV insertion
site.

Measurements:

LVEDV 172 mL

LVSV 76 mL
LVEF 44%

RVEDV 165 mL
RVSV 90 mL
RVEF 54%
IMPRESSION: 1. Normal LV size with EF 44%. Basal anteroseptal and basal
inferolateral hypokinesis.

2.  Normal RV size and systolic function, EF 54%.

3. LGE pattern as above. The anteroseptal and inferoseptal LGE
appears to be in a non-coronary pattern, the inferolateral LGE is
subendocardial to mid wall and could be due to prior MI. This
certainly could be due to cardiac sarcoidosis, but think he should
have coronary disease ruled out. If coronary disease is ruled out,
would suggest cardiac PET for further evaluation for cardiac
sarcoidosis (alternative would be prior myocarditis).

Jhuniior Sayo

## 2022-06-15 ENCOUNTER — Other Ambulatory Visit: Payer: Self-pay | Admitting: Cardiology

## 2022-06-15 NOTE — Telephone Encounter (Signed)
Pt last saw Dr Bettina Gavia 12/22/21, last labs 12/23/21 Creat 1.0, age 72,weight 102.1kg, based on specified criteria pt is on appropriate dosage of Eliquis 5mg  BID for afib.  Will refill rx.

## 2022-10-08 NOTE — Progress Notes (Signed)
Cardiology Office Note:  .   Date:  10/09/2022  ID:  John Ingram, DOB 1950/08/31, MRN [id number] PCP: Eloisa Northern, MD  [facility] Providers Cardiologist:  None    History of Present Illness: .    John Ingram is a 72 y.o. male with a past medical history of systolic heart failure, hypertension, PAF on amiodarone and Eliquis, history of alcohol abuse, sarcoidosis, aortic atherosclerosis noted on CT imaging.  06/01/20 cMRI EF 44% with basal anteroseptal and inferolateral hypokinesia, some late gadolinium enhancement with recommendations for evaluation of CAD 06/22/2020 echo at [facility] EF 30%, severe global hypokinesis of LV, mild MR, mild TR  Most recently evaluated by Dr. Dulce Sellar on 12/22/2021 at that time he was doing well from a cardiac perspective, maintaining sinus rhythm, continuing low-dose amiodarone at 100 mg daily.  He presents today for follow-up of his heart failure as well as his atrial fibrillation.  He is doing well from a cardiac perspective, offers no complaints.  He has not noticed any overt palpitations, does say he feels his heart skip a beat every once in a while, he does not have a smart watch or any way to monitor his rhythm.  He does not do any formal exercise however he does stay busy to when yard work, completes this without any incidents.  He denies hematochezia, hematuria, hemoptysis. He denies chest pain, palpitations, dyspnea, pnd, orthopnea, n, v, dizziness, syncope, edema, weight gain, or early satiety.    ROS: Review of Systems  Constitutional: Negative.   HENT: Negative.    Eyes: Negative.   Respiratory: Negative.    Cardiovascular:  Positive for palpitations.  Gastrointestinal: Negative.   Genitourinary: Negative.   Musculoskeletal: Negative.   Skin: Negative.   Neurological: Negative.   Endo/Heme/Allergies: Negative.   Psychiatric/Behavioral: Negative.      Studies Reviewed: Marland Kitchen   EKG Interpretation Date/Time:  Monday October 09 2022  13:50:13 EDT Ventricular Rate:  88 PR Interval:  194 QRS Duration:  90 QT Interval:  374 QTC Calculation: 452 R Axis:   18  Text Interpretation: Normal sinus rhythm Normal ECG No previous ECGs available Confirmed by Wallis Bamberg [phone]) on 10/09/2022 1:56:10 PM    Cardiac Studies & Procedures           CARDIAC MRI  MR CARDIAC MORPHOLOGY W WO CONTRAST 06/01/2020  Narrative CLINICAL DATA:  Assess for cardiac sarcoidosis  EXAM: CARDIAC MRI  TECHNIQUE: The patient was scanned on a 1.5 Tesla GE magnet. A dedicated cardiac coil was used. Functional imaging was done using Fiesta sequences. 2,3, and 4 chamber views were done to assess for RWMA's. Modified Simpson's rule using a short axis stack was used to calculate an ejection fraction on a dedicated work [employer]. The patient received 8 cc of Gadavist. After 10 minutes inversion recovery sequences were used to assess for infiltration and scar tissue.  FINDINGS: Limited images of the lung fields show no gross abnormalities.  Normal left ventricular size and wall thickness. Mild basal anteroseptal and basal inferolateral hypokinesis. Normal right ventricular size and systolic function, EF 54%. Mild left atrial enlargement. Normal right atrium. Mild mitral regurgitation. Trileaflet aortic valve with no stenosis or regurgitation noted.  On delayed enhancement images, there is mid-wall late gadolinium enhancement (LGE) in the basal anteroseptal wall and subendocardial to mid-wall LGE in the basal to mid inferolateral wall. There is mid-wall LGE in the basal inferoseptal wall at the RV insertion site.  Measurements:  LVEDV  172 mL  LVSV 76 mL LVEF 44%  RVEDV 165 mL RVSV 90 mL RVEF 54%  IMPRESSION: 1. Normal LV size with EF 44%. Basal anteroseptal and basal inferolateral hypokinesis.  2.  Normal RV size and systolic function, EF 54%.  3. LGE pattern as above. The anteroseptal and inferoseptal  LGE appears to be in a non-coronary pattern, the inferolateral LGE is subendocardial to mid wall and could be due to prior MI. This certainly could be due to cardiac sarcoidosis, but think he should have coronary disease ruled out. If coronary disease is ruled out, would suggest cardiac PET for further evaluation for cardiac sarcoidosis (alternative would be prior myocarditis).  [name]   Electronically Signed By: Marca Ancona M.D. On: 06/01/2020 16:38         Risk Assessment/Calculations:    CHA2DS2-VASc Score = 4   This indicates a 4.8% annual risk of stroke. The patient's score is based upon: CHF History: 1 HTN History: 1 Diabetes History: 0 Stroke History: 0 Vascular Disease History: 1 Age Score: 1 Gender Score: 0            Physical Exam:   VS:  BP 130/88 (BP Location: Left Arm, Patient Position: Sitting, Cuff Size: Normal)   Pulse 88   Ht 5\' 10"  (1.778 m)   Wt 229 lb 9.6 oz (104.1 kg)   SpO2 96%   BMI 32.94 kg/m    Wt Readings from Last 3 Encounters:  10/09/22 229 lb 9.6 oz (104.1 kg)  12/22/21 225 lb (102.1 kg)  06/14/21 223 lb (101.2 kg)    GEN: Well nourished, well developed in no acute distress NECK: No JVD; No carotid bruits CARDIAC: RRR, no murmurs, rubs, gallops RESPIRATORY:  Clear to auscultation without rales, wheezing or rhonchi  ABDOMEN: Soft, non-tender, non-distended EXTREMITIES:  No edema; No deformity   ASSESSMENT AND PLAN: .   HFrEF -most recent EF per cardiac MRI was 44%, NYHA class I, euvolemic, continue Entresto 97-103 mg twice daily, continue metoprolol 25 mg daily, continue Lasix 40 mg daily.  Declined SGLT2 therapy as he was feeling stable from a cardiac perspective.  Aortic atherosclerosis noted on CT imaging- Stable with no anginal symptoms. No indication for ischemic evaluation.  Repeating lipid panel per below.  Would prefer LDL to be less than 70.  PAF/hypercoagulable state/amiodarone therapy -he is maintaining sinus  rhythm today, CHA2DS2-VASc score of 4, continue Eliquis 5 mg twice daily--no indication for dose reduction.  Will repeat CBC, CMET today.  Will check LFTs and thyroid panel for his amiodarone therapy.  Hyperlipidemia - cholesterol has not been checked in some time, will repeat FLP and LFTs. Continue Pravachol 20 mg daily.      Dispo: FLP, CMET, TSH, Return in 6 months.   Signed, Flossie Dibble, NP

## 2022-10-09 ENCOUNTER — Encounter: Payer: Self-pay | Admitting: Cardiology

## 2022-10-09 ENCOUNTER — Ambulatory Visit: Payer: Self-pay | Attending: Cardiology | Admitting: Cardiology

## 2022-10-09 VITALS — BP 130/88 | HR 88 | Ht 70.0 in | Wt 229.6 lb

## 2022-10-09 DIAGNOSIS — I480 Paroxysmal atrial fibrillation: Secondary | ICD-10-CM

## 2022-10-09 DIAGNOSIS — I700 Atherosclerosis of aorta: Secondary | ICD-10-CM

## 2022-10-09 DIAGNOSIS — Z79899 Other long term (current) drug therapy: Secondary | ICD-10-CM

## 2022-10-09 DIAGNOSIS — Z7901 Long term (current) use of anticoagulants: Secondary | ICD-10-CM

## 2022-10-09 DIAGNOSIS — I5020 Unspecified systolic (congestive) heart failure: Secondary | ICD-10-CM

## 2022-10-09 NOTE — Patient Instructions (Signed)
Medication Instructions:  Your physician recommends that you continue on your current medications as directed. Please refer to the Current Medication list given to you today.  *If you need a refill on your cardiac medications before your next appointment, please call your pharmacy*   Lab Work: Your physician recommends that you return for lab work in: Today for CMP, CBC, Lipid, T3, T4 and TSH  If you have labs (blood work) drawn today and your tests are completely normal, you will receive your results only by: MyChart Message (if you have MyChart) OR A paper copy in the mail If you have any lab test that is abnormal or we need to change your treatment, we will call you to review the results.   Testing/Procedures: NONE   Follow-Up: At [facility], you and your health needs are our priority.  As part of our continuing mission to provide you with exceptional heart care, we have created designated Provider Care Teams.  These Care Teams include your primary Cardiologist (physician) and Advanced Practice Providers (APPs -  Physician Assistants and Nurse Practitioners) who all work together to provide you with the care you need, when you need it.  We recommend signing up for the patient portal called "MyChart".  Sign up information is provided on this After Visit Summary.  MyChart is used to connect with patients for Virtual Visits (Telemedicine).  Patients are able to view lab/test results, encounter notes, upcoming appointments, etc.  Non-urgent messages can be sent to your provider as well.   To learn more about what you can do with MyChart, go to [email or web address].    Your next appointment:   6 month(s)  Provider:   Norman Herrlich, MD    Other Instructions

## 2022-10-10 LAB — COMPREHENSIVE METABOLIC PANEL WITH GFR
ALT: 17 IU/L (ref 0–44)
AST: 26 IU/L (ref 0–40)
Albumin: 4.3 g/dL (ref 3.9–4.9)
Alkaline Phosphatase: 117 IU/L (ref 44–121)
BUN/Creatinine Ratio: 10 (ref 10–24)
BUN: 10 mg/dL (ref 8–27)
Bilirubin Total: 0.5 mg/dL (ref 0.0–1.2)
CO2: 23 mmol/L (ref 20–29)
Calcium: 8.7 mg/dL (ref 8.6–10.2)
Chloride: 104 mmol/L (ref 96–106)
Creatinine, Ser: 1.04 mg/dL (ref 0.76–1.27)
Globulin, Total: 1.8 g/dL (ref 1.5–4.5)
Glucose: 89 mg/dL (ref 70–99)
Potassium: 4.1 mmol/L (ref 3.5–5.2)
Sodium: 145 mmol/L — ABNORMAL HIGH (ref 134–144)
Total Protein: 6.1 g/dL (ref 6.0–8.5)
eGFR: 77 mL/min/1.73

## 2022-10-10 LAB — CBC WITH DIFFERENTIAL/PLATELET
Basophils Absolute: 0 x10E3/uL (ref 0.0–0.2)
Basos: 1 %
EOS (ABSOLUTE): 0.1 x10E3/uL (ref 0.0–0.4)
Eos: 3 %
Hematocrit: 42.5 % (ref 37.5–51.0)
Hemoglobin: 14.7 g/dL (ref 13.0–17.7)
Immature Grans (Abs): 0 x10E3/uL (ref 0.0–0.1)
Immature Granulocytes: 1 %
Lymphocytes Absolute: 1.5 x10E3/uL (ref 0.7–3.1)
Lymphs: 36 %
MCH: 32.2 pg (ref 26.6–33.0)
MCHC: 34.6 g/dL (ref 31.5–35.7)
MCV: 93 fL (ref 79–97)
Monocytes Absolute: 0.4 x10E3/uL (ref 0.1–0.9)
Monocytes: 10 %
Neutrophils Absolute: 2 x10E3/uL (ref 1.4–7.0)
Neutrophils: 49 %
Platelets: 219 x10E3/uL (ref 150–450)
RBC: 4.56 x10E6/uL (ref 4.14–5.80)
RDW: 12.9 % (ref 11.6–15.4)
WBC: 4 x10E3/uL (ref 3.4–10.8)

## 2022-10-10 LAB — LIPID PANEL
Chol/HDL Ratio: 3.5 ratio (ref 0.0–5.0)
Cholesterol, Total: 206 mg/dL — ABNORMAL HIGH (ref 100–199)
HDL: 59 mg/dL
LDL Chol Calc (NIH): 91 mg/dL (ref 0–99)
Triglycerides: 339 mg/dL — ABNORMAL HIGH (ref 0–149)
VLDL Cholesterol Cal: 56 mg/dL — ABNORMAL HIGH (ref 5–40)

## 2022-10-10 LAB — T3, FREE: T3, Free: 2.4 pg/mL (ref 2.0–4.4)

## 2022-10-10 LAB — TSH: TSH: 2.57 u[IU]/mL (ref 0.450–4.500)

## 2022-10-10 LAB — T4, FREE: Free T4: 1.36 ng/dL (ref 0.82–1.77)

## 2022-10-11 ENCOUNTER — Telehealth: Payer: Self-pay

## 2022-10-11 DIAGNOSIS — I700 Atherosclerosis of aorta: Secondary | ICD-10-CM

## 2022-10-11 MED ORDER — PRAVASTATIN SODIUM 40 MG PO TABS: 40.0000 mg | ORAL_TABLET | Freq: Every evening | ORAL | 3 refills | Status: DC

## 2022-10-11 NOTE — Telephone Encounter (Signed)
-----   Message from Flossie Dibble sent at 10/10/2022  8:05 AM EDT ----- Cholesterol is too high, increase your pravachol to 40 mg daily. Return for FLP and LFTs in 8 weeks.

## 2022-10-13 ENCOUNTER — Telehealth: Payer: Self-pay

## 2022-10-13 DIAGNOSIS — Z79899 Other long term (current) drug therapy: Secondary | ICD-10-CM

## 2022-10-13 DIAGNOSIS — E7800 Pure hypercholesterolemia, unspecified: Secondary | ICD-10-CM

## 2022-10-13 MED ORDER — PRAVASTATIN SODIUM 80 MG PO TABS: 80.0000 mg | ORAL_TABLET | Freq: Every evening | ORAL | 0 refills | Status: DC

## 2022-10-13 NOTE — Telephone Encounter (Signed)
Patient notified of results and recommendations and agreed with plan, medication sent, lab order on file.   

## 2022-10-13 NOTE — Telephone Encounter (Signed)
-----   Message from Flossie Dibble sent at 10/11/2022 11:33 AM EDT ----- Cholesterol is too high, increase pravastatin to 80 mg daily. Repeat FLP and LFTs in 8 weeks.  All other labs look good.

## 2022-10-27 ENCOUNTER — Other Ambulatory Visit: Payer: Self-pay | Admitting: Cardiology

## 2022-11-29 ENCOUNTER — Other Ambulatory Visit: Payer: Self-pay | Admitting: Cardiology

## 2023-01-08 ENCOUNTER — Other Ambulatory Visit: Payer: Self-pay | Admitting: Cardiology

## 2023-01-16 ENCOUNTER — Other Ambulatory Visit: Payer: Self-pay

## 2023-01-16 MED ORDER — PRAVASTATIN SODIUM 80 MG PO TABS: 80.0000 mg | ORAL_TABLET | Freq: Every evening | ORAL | 2 refills | Status: DC

## 2023-01-25 ENCOUNTER — Other Ambulatory Visit: Payer: Self-pay | Admitting: Cardiology

## 2023-01-29 NOTE — Telephone Encounter (Signed)
Prescription refill request for Eliquis received. Indication: PAF Last office visit: 10/09/22  Anson [place] NP Scr: 1.04 on 10/09/22  Epic Age: 72 Weight: 104.1kg  Based on above findings Eliquis 5mg  twice daily is the appropriate dose. Refill approved.

## 2023-03-01 ENCOUNTER — Other Ambulatory Visit: Payer: Self-pay | Admitting: Cardiology

## 2023-03-26 ENCOUNTER — Other Ambulatory Visit: Payer: Self-pay

## 2023-03-26 MED ORDER — FUROSEMIDE 40 MG PO TABS: 40.0000 mg | ORAL_TABLET | Freq: Two times a day (BID) | ORAL | 1 refills | Status: DC

## 2023-04-16 ENCOUNTER — Telehealth: Payer: Self-pay | Admitting: Cardiology

## 2023-04-16 MED ORDER — METOPROLOL TARTRATE 25 MG PO TABS: 25.0000 mg | ORAL_TABLET | Freq: Two times a day (BID) | ORAL | 1 refills | Status: DC

## 2023-04-16 NOTE — Telephone Encounter (Signed)
OUT OF METOPROLOL - please send to [facility] on [address]- please call patient when sent [phone]

## 2023-04-16 NOTE — Telephone Encounter (Signed)
Called patient to inform him that his refill has been sent to the pharmacy.

## 2023-04-19 ENCOUNTER — Ambulatory Visit: Payer: Self-pay | Admitting: Cardiology

## 2023-04-20 ENCOUNTER — Ambulatory Visit: Payer: Self-pay | Admitting: Cardiology

## 2023-04-26 ENCOUNTER — Other Ambulatory Visit: Payer: Self-pay | Admitting: Cardiology

## 2023-04-26 ENCOUNTER — Encounter: Payer: Self-pay | Admitting: Cardiology

## 2023-04-26 ENCOUNTER — Ambulatory Visit: Payer: Self-pay | Attending: Cardiology | Admitting: Cardiology

## 2023-04-26 VITALS — BP 157/98 | HR 88 | Ht 70.0 in | Wt 229.6 lb

## 2023-04-26 DIAGNOSIS — I10 Essential (primary) hypertension: Secondary | ICD-10-CM

## 2023-04-26 DIAGNOSIS — I480 Paroxysmal atrial fibrillation: Secondary | ICD-10-CM

## 2023-04-26 DIAGNOSIS — D6859 Other primary thrombophilia: Secondary | ICD-10-CM

## 2023-04-26 DIAGNOSIS — Z79899 Other long term (current) drug therapy: Secondary | ICD-10-CM

## 2023-04-26 DIAGNOSIS — I700 Atherosclerosis of aorta: Secondary | ICD-10-CM

## 2023-04-26 MED ORDER — METOPROLOL TARTRATE 50 MG PO TABS: 50.0000 mg | ORAL_TABLET | Freq: Two times a day (BID) | ORAL | 3 refills | Status: AC

## 2023-04-26 MED ORDER — POTASSIUM CHLORIDE CRYS ER 20 MEQ PO TBCR: 20.0000 meq | EXTENDED_RELEASE_TABLET | Freq: Every day | ORAL | 1 refills | Status: DC

## 2023-04-26 MED ORDER — AMIODARONE HCL 200 MG PO TABS: 200.0000 mg | ORAL_TABLET | ORAL | 3 refills | Status: AC

## 2023-04-26 MED ORDER — ELIQUIS 5 MG PO TABS: 5.0000 mg | ORAL_TABLET | Freq: Two times a day (BID) | ORAL | 1 refills | Status: DC

## 2023-04-26 NOTE — Telephone Encounter (Signed)
Prescription sent to pharmacy.

## 2023-04-26 NOTE — Progress Notes (Signed)
Cardiology Office Note:  .   Date:  04/26/2023  ID:  John Ingram, DOB 1950/07/15, MRN [id number] PCP: Eloisa Northern, MD   HeartCare Providers Cardiologist:  Norman Herrlich, MD    History of Present Illness: .    John Ingram is a 73 y.o. male with a past medical history of HFrEF hypertension, PAF on amiodarone and Eliquis, history of alcohol abuse, sarcoidosis, aortic atherosclerosis noted on CT imaging.  06/01/20 cMRI EF 44% with basal anteroseptal and inferolateral hypokinesia, some late gadolinium enhancement with recommendations for evaluation of CAD 06/22/2020 echo at [facility] EF 30%, severe global hypokinesis of LV, mild MR, mild TR  Evaluated by Dr. Dulce Sellar on 12/22/2021 at that time he was doing well from a cardiac perspective, maintaining sinus rhythm, continuing low-dose amiodarone at 100 mg daily.  He presents today for follow-up of his heart failure as well as his atrial fibrillation.  He is doing well from a cardiac perspective, offers no complaints.  He has not noticed any overt palpitations, does say he feels his heart skip a beat every once in a while, he does not have a smart watch or any way to monitor his rhythm.  He does not do any formal exercise however he does stay busy to when yard work, completes this without any incidents.  His blood pressure is elevated in the office today however there is been much confusion surrounding his medications-as he apparently has been out of several of them for some time, thought that maybe since he did not have any refills left he noted not needed to take some of them.  He denies hematochezia, hematuria, hemoptysis. He denies chest pain, palpitations, dyspnea, pnd, orthopnea, n, v, dizziness, syncope, edema, weight gain, or early satiety.    ROS: Review of Systems  Constitutional: Negative.   HENT: Negative.    Eyes: Negative.   Respiratory: Negative.    Cardiovascular:  Positive for palpitations.  Gastrointestinal:  Negative.   Genitourinary: Negative.   Musculoskeletal: Negative.   Skin: Negative.   Neurological: Negative.   Endo/Heme/Allergies: Negative.   Psychiatric/Behavioral: Negative.      Studies Reviewed: .        Cardiac Studies & Procedures          CARDIAC MRI  MR CARDIAC MORPHOLOGY W WO CONTRAST 06/01/2020  Narrative CLINICAL DATA:  Assess for cardiac sarcoidosis  EXAM: CARDIAC MRI  TECHNIQUE: The patient was scanned on a 1.5 Tesla GE magnet. A dedicated cardiac coil was used. Functional imaging was done using Fiesta sequences. 2,3, and 4 chamber views were done to assess for RWMA's. Modified Simpson's rule using a short axis stack was used to calculate an ejection fraction on a dedicated work [employer]. The patient received 8 cc of Gadavist. After 10 minutes inversion recovery sequences were used to assess for infiltration and scar tissue.  FINDINGS: Limited images of the lung fields show no gross abnormalities.  Normal left ventricular size and wall thickness. Mild basal anteroseptal and basal inferolateral hypokinesis. Normal right ventricular size and systolic function, EF 54%. Mild left atrial enlargement. Normal right atrium. Mild mitral regurgitation. Trileaflet aortic valve with no stenosis or regurgitation noted.  On delayed enhancement images, there is mid-wall late gadolinium enhancement (LGE) in the basal anteroseptal wall and subendocardial to mid-wall LGE in the basal to mid inferolateral wall. There is mid-wall LGE in the basal inferoseptal wall at the RV insertion site.  Measurements:  LVEDV 172 mL  LVSV 76  mL LVEF 44%  RVEDV 165 mL RVSV 90 mL RVEF 54%  IMPRESSION: 1. Normal LV size with EF 44%. Basal anteroseptal and basal inferolateral hypokinesis.  2.  Normal RV size and systolic function, EF 54%.  3. LGE pattern as above. The anteroseptal and inferoseptal LGE appears to be in a non-coronary pattern, the  inferolateral LGE is subendocardial to mid wall and could be due to prior MI. This certainly could be due to cardiac sarcoidosis, but think he should have coronary disease ruled out. If coronary disease is ruled out, would suggest cardiac PET for further evaluation for cardiac sarcoidosis (alternative would be prior myocarditis).  [name]   Electronically Signed By: Marca Ancona M.D. On: 06/01/2020 16:38         Risk Assessment/Calculations:    CHA2DS2-VASc Score = 4   This indicates a 4.8% annual risk of stroke. The patient's score is based upon: CHF History: 1 HTN History: 1 Diabetes History: 0 Stroke History: 0 Vascular Disease History: 1 Age Score: 1 Gender Score: 0         Physical Exam:   VS:  BP (!) 160/100    Wt Readings from Last 3 Encounters:  10/09/22 229 lb 9.6 oz (104.1 kg)  12/22/21 225 lb (102.1 kg)  06/14/21 223 lb (101.2 kg)    GEN: Well nourished, well developed in no acute distress NECK: No JVD; No carotid bruits CARDIAC: RRR, no murmurs, rubs, gallops RESPIRATORY:  Clear to auscultation without rales, wheezing or rhonchi  ABDOMEN: Soft, non-tender, non-distended EXTREMITIES:  No edema; No deformity   ASSESSMENT AND PLAN: .   HFrEF -most recent EF per cardiac MRI was 44%, NYHA class I, euvolemic, continue Entresto 97-103 mg twice daily, continue metoprolol 25 mg daily, continue Lasix 40 mg daily.  Declined SGLT2 therapy as he was feeling stable from a cardiac perspective.  Aortic atherosclerosis noted on CT imaging- Stable with no anginal symptoms. No indication for ischemic evaluation.  Most recent LDL was elevated at 91, we previously made adjustments to his pravastatin however he has been out of it for several months, he restarted a week or so ago, will repeat at next OV.  PAF/hypercoagulable state/amiodarone therapy -he is maintaining sinus rhythm today, CHA2DS2-VASc score of 4, continue Eliquis 5 mg twice daily--no indication for dose  reduction.  Will repeat CBC, CMET today.   Hyperlipidemia -most recent LDL was elevated 91, made adjustments to his statin however he is in out of it for some time.  Will check at next OV.     Dispo: CBC, CMP, follow-up in 6 months.  Signed, Flossie Dibble, NP

## 2023-04-26 NOTE — Patient Instructions (Addendum)
Medication Instructions:    Increase your Metoprolol to 50 mg twice a day.  *If you need a refill on your cardiac medications before your next appointment, please call your pharmacy*   Lab Work: Your physician recommends that you have labs done in the office today. Your test included  complete metabolic panel, complete blood count. If you have labs (blood work) drawn today and your tests are completely normal, you will receive your results only by: MyChart Message (if you have MyChart) OR A paper copy in the mail If you have any lab test that is abnormal or we need to change your treatment, we will call you to review the results.   Testing/Procedures: None Ordered   Follow-Up: At [facility], you and your health needs are our priority.  As part of our continuing mission to provide you with exceptional heart care, we have created designated Provider Care Teams.  These Care Teams include your primary Cardiologist (physician) and Advanced Practice Providers (APPs -  Physician Assistants and Nurse Practitioners) who all work together to provide you with the care you need, when you need it.  We recommend signing up for the patient portal called "MyChart".  Sign up information is provided on this After Visit Summary.  MyChart is used to connect with patients for Virtual Visits (Telemedicine).  Patients are able to view lab/test results, encounter notes, upcoming appointments, etc.  Non-urgent messages can be sent to your provider as well.   To learn more about what you can do with MyChart, go to [email or web address].    Your next appointment:    6 month

## 2023-04-27 LAB — COMPREHENSIVE METABOLIC PANEL WITH GFR
ALT: 19 IU/L (ref 0–44)
AST: 25 IU/L (ref 0–40)
Albumin: 4.3 g/dL (ref 3.8–4.8)
Alkaline Phosphatase: 122 IU/L — ABNORMAL HIGH (ref 44–121)
BUN/Creatinine Ratio: 11 (ref 10–24)
BUN: 11 mg/dL (ref 8–27)
Bilirubin Total: 0.7 mg/dL (ref 0.0–1.2)
CO2: 24 mmol/L (ref 20–29)
Calcium: 8.8 mg/dL (ref 8.6–10.2)
Chloride: 101 mmol/L (ref 96–106)
Creatinine, Ser: 1 mg/dL (ref 0.76–1.27)
Globulin, Total: 2.2 g/dL (ref 1.5–4.5)
Glucose: 94 mg/dL (ref 70–99)
Potassium: 4.1 mmol/L (ref 3.5–5.2)
Sodium: 145 mmol/L — ABNORMAL HIGH (ref 134–144)
Total Protein: 6.5 g/dL (ref 6.0–8.5)
eGFR: 80 mL/min/1.73

## 2023-04-27 LAB — CBC
Hematocrit: 48 % (ref 37.5–51.0)
Hemoglobin: 16.2 g/dL (ref 13.0–17.7)
MCH: 31.7 pg (ref 26.6–33.0)
MCHC: 33.8 g/dL (ref 31.5–35.7)
MCV: 94 fL (ref 79–97)
Platelets: 229 x10E3/uL (ref 150–450)
RBC: 5.11 x10E6/uL (ref 4.14–5.80)
RDW: 12.6 % (ref 11.6–15.4)
WBC: 5.9 x10E3/uL (ref 3.4–10.8)

## 2023-04-30 ENCOUNTER — Telehealth: Payer: Self-pay | Admitting: Emergency Medicine

## 2023-04-30 NOTE — Telephone Encounter (Signed)
-----   Message from Flossie Dibble sent at 04/30/2023  9:24 AM EST ----- Labs are stable.

## 2023-04-30 NOTE — Telephone Encounter (Signed)
 Results reviewed with pt as per Wallis Bamberg NP's note.  Pt verbalized understanding and had no additional questions. Routed to PCP.

## 2023-09-19 ENCOUNTER — Other Ambulatory Visit: Payer: Self-pay | Admitting: Cardiology

## 2023-09-21 NOTE — Telephone Encounter (Signed)
 Rx refill sent to pharmacy.

## 2023-10-07 ENCOUNTER — Other Ambulatory Visit: Payer: Self-pay | Admitting: Cardiology

## 2023-11-22 ENCOUNTER — Other Ambulatory Visit: Payer: Self-pay | Admitting: Cardiology

## 2023-12-17 ENCOUNTER — Other Ambulatory Visit: Payer: Self-pay | Admitting: Cardiology

## 2023-12-27 ENCOUNTER — Other Ambulatory Visit: Payer: Self-pay

## 2023-12-27 MED ORDER — FUROSEMIDE 40 MG PO TABS: 40.0000 mg | ORAL_TABLET | Freq: Two times a day (BID) | ORAL | 0 refills | Status: DC

## 2023-12-28 ENCOUNTER — Other Ambulatory Visit: Payer: Self-pay | Admitting: Cardiology

## 2024-01-03 ENCOUNTER — Other Ambulatory Visit: Payer: Self-pay | Admitting: Cardiology

## 2024-01-05 ENCOUNTER — Other Ambulatory Visit: Payer: Self-pay | Admitting: Cardiology

## 2024-02-18 ENCOUNTER — Other Ambulatory Visit: Payer: Self-pay | Admitting: Cardiology

## 2024-03-05 ENCOUNTER — Other Ambulatory Visit: Payer: Self-pay | Admitting: Cardiology

## 2024-03-05 NOTE — Telephone Encounter (Signed)
 Pt last saw Delon Hoover, NP on 04/26/23, last labs 04/26/23 Creat 1.0, age 73, weight 104.1kg, based on specified criteria pt is on appropriate dosage of Eliquis  5mg  BID for afib.  Will refill rx.

## 2024-04-01 ENCOUNTER — Other Ambulatory Visit: Payer: Self-pay

## 2024-04-01 MED ORDER — FUROSEMIDE 40 MG PO TABS: 40.0000 mg | ORAL_TABLET | Freq: Two times a day (BID) | ORAL | 0 refills | Status: DC

## 2024-04-22 ENCOUNTER — Other Ambulatory Visit: Payer: Self-pay

## 2024-04-22 MED ORDER — PRAVASTATIN SODIUM 80 MG PO TABS: 80.0000 mg | ORAL_TABLET | Freq: Every evening | ORAL | 0 refills | Status: AC

## 2024-04-28 ENCOUNTER — Other Ambulatory Visit: Payer: Self-pay | Admitting: Cardiology
